# Patient Record
Sex: Female | Born: 1970 | Race: White | Hispanic: No | Marital: Married | Smoking: Never smoker
Health system: Southern US, Community
[De-identification: ages and names within clinical notes are randomized; demographics above are authoritative.]

## PROBLEM LIST (undated history)

## (undated) DIAGNOSIS — K759 Inflammatory liver disease, unspecified: Secondary | ICD-10-CM

## (undated) DIAGNOSIS — R3915 Urgency of urination: Secondary | ICD-10-CM

## (undated) DIAGNOSIS — F32A Depression, unspecified: Secondary | ICD-10-CM

## (undated) DIAGNOSIS — G4730 Sleep apnea, unspecified: Secondary | ICD-10-CM

## (undated) DIAGNOSIS — R51 Headache: Secondary | ICD-10-CM

## (undated) DIAGNOSIS — R351 Nocturia: Secondary | ICD-10-CM

## (undated) DIAGNOSIS — F329 Major depressive disorder, single episode, unspecified: Secondary | ICD-10-CM

## (undated) DIAGNOSIS — M1990 Unspecified osteoarthritis, unspecified site: Secondary | ICD-10-CM

## (undated) DIAGNOSIS — Z87410 Personal history of cervical dysplasia: Secondary | ICD-10-CM

## (undated) HISTORY — PX: COLONOSCOPY: SHX174

## (undated) HISTORY — DX: Unspecified osteoarthritis, unspecified site: M19.90

## (undated) HISTORY — DX: Morbid (severe) obesity due to excess calories: E66.01

---

## 1992-10-20 DIAGNOSIS — K759 Inflammatory liver disease, unspecified: Secondary | ICD-10-CM

## 1992-10-20 HISTORY — DX: Inflammatory liver disease, unspecified: K75.9

## 2000-01-15 ENCOUNTER — Other Ambulatory Visit: Admission: RE | Admit: 2000-01-15 | Discharge: 2000-01-15 | Payer: Self-pay | Admitting: Family Medicine

## 2001-03-12 ENCOUNTER — Other Ambulatory Visit: Admission: RE | Admit: 2001-03-12 | Discharge: 2001-03-12 | Payer: Self-pay | Admitting: Family Medicine

## 2001-06-17 ENCOUNTER — Other Ambulatory Visit: Admission: RE | Admit: 2001-06-17 | Discharge: 2001-06-17 | Payer: Self-pay | Admitting: Family Medicine

## 2001-12-09 ENCOUNTER — Other Ambulatory Visit: Admission: RE | Admit: 2001-12-09 | Discharge: 2001-12-09 | Payer: Self-pay | Admitting: Obstetrics and Gynecology

## 2002-06-21 ENCOUNTER — Ambulatory Visit (HOSPITAL_COMMUNITY): Admission: RE | Admit: 2002-06-21 | Discharge: 2002-06-21 | Payer: Self-pay | Admitting: Obstetrics

## 2002-06-21 ENCOUNTER — Encounter: Payer: Self-pay | Admitting: Obstetrics

## 2002-08-05 ENCOUNTER — Encounter: Payer: Self-pay | Admitting: Obstetrics & Gynecology

## 2002-08-05 ENCOUNTER — Ambulatory Visit (HOSPITAL_COMMUNITY): Admission: RE | Admit: 2002-08-05 | Discharge: 2002-08-05 | Payer: Self-pay | Admitting: Obstetrics & Gynecology

## 2002-10-20 HISTORY — PX: WISDOM TOOTH EXTRACTION: SHX21

## 2002-11-11 ENCOUNTER — Encounter: Payer: Self-pay | Admitting: Obstetrics & Gynecology

## 2002-11-11 ENCOUNTER — Ambulatory Visit (HOSPITAL_COMMUNITY): Admission: RE | Admit: 2002-11-11 | Discharge: 2002-11-11 | Payer: Self-pay | Admitting: Obstetrics & Gynecology

## 2002-11-24 ENCOUNTER — Ambulatory Visit (HOSPITAL_COMMUNITY): Admission: RE | Admit: 2002-11-24 | Discharge: 2002-11-24 | Payer: Self-pay | Admitting: Obstetrics & Gynecology

## 2002-11-24 ENCOUNTER — Encounter: Payer: Self-pay | Admitting: Obstetrics & Gynecology

## 2002-12-30 ENCOUNTER — Encounter (INDEPENDENT_AMBULATORY_CARE_PROVIDER_SITE_OTHER): Payer: Self-pay | Admitting: Specialist

## 2002-12-30 ENCOUNTER — Inpatient Hospital Stay (HOSPITAL_COMMUNITY): Admission: AD | Admit: 2002-12-30 | Discharge: 2003-01-02 | Payer: Self-pay | Admitting: Obstetrics & Gynecology

## 2004-03-27 ENCOUNTER — Inpatient Hospital Stay (HOSPITAL_COMMUNITY): Admission: AD | Admit: 2004-03-27 | Discharge: 2004-03-27 | Payer: Self-pay | Admitting: Obstetrics

## 2004-09-05 ENCOUNTER — Ambulatory Visit (HOSPITAL_COMMUNITY): Admission: RE | Admit: 2004-09-05 | Discharge: 2004-09-05 | Payer: Self-pay | Admitting: Obstetrics & Gynecology

## 2005-01-29 ENCOUNTER — Inpatient Hospital Stay (HOSPITAL_COMMUNITY): Admission: AD | Admit: 2005-01-29 | Discharge: 2005-01-31 | Payer: Self-pay | Admitting: Obstetrics & Gynecology

## 2006-02-26 ENCOUNTER — Ambulatory Visit (HOSPITAL_COMMUNITY): Admission: RE | Admit: 2006-02-26 | Discharge: 2006-02-26 | Payer: Self-pay | Admitting: Obstetrics & Gynecology

## 2006-07-01 ENCOUNTER — Inpatient Hospital Stay (HOSPITAL_COMMUNITY): Admission: AD | Admit: 2006-07-01 | Discharge: 2006-07-04 | Payer: Self-pay | Admitting: Obstetrics & Gynecology

## 2006-07-01 ENCOUNTER — Inpatient Hospital Stay (HOSPITAL_COMMUNITY): Admission: AD | Admit: 2006-07-01 | Discharge: 2006-07-01 | Payer: Self-pay | Admitting: Obstetrics & Gynecology

## 2008-02-09 ENCOUNTER — Ambulatory Visit (HOSPITAL_COMMUNITY): Admission: RE | Admit: 2008-02-09 | Discharge: 2008-02-09 | Payer: Self-pay | Admitting: Obstetrics & Gynecology

## 2009-09-25 ENCOUNTER — Ambulatory Visit (HOSPITAL_COMMUNITY): Admission: RE | Admit: 2009-09-25 | Discharge: 2009-09-25 | Payer: Self-pay | Admitting: Obstetrics & Gynecology

## 2010-10-16 ENCOUNTER — Ambulatory Visit (HOSPITAL_COMMUNITY)
Admission: RE | Admit: 2010-10-16 | Discharge: 2010-10-16 | Payer: Self-pay | Source: Home / Self Care | Attending: Obstetrics & Gynecology | Admitting: Obstetrics & Gynecology

## 2010-11-27 ENCOUNTER — Encounter: Payer: Self-pay | Admitting: Internal Medicine

## 2010-12-17 NOTE — Letter (Signed)
Summary: Pt cancelled appt/[facility]  Pt cancelled appt/[facility]   Imported By: Sherian Rein 12/09/2010 08:22:27  _____________________________________________________________________  External Attachment:    Type:   Image     Comment:   External Document

## 2011-03-07 NOTE — H&P (Signed)
NAME:  Monique Hamilton, Monique Hamilton NO.:  [id number]   MEDICAL RECORD NO.:  [id number]          PATIENT TYPE:  INP   LOCATION:  9173                          FACILITY:  [facility]   PHYSICIAN:  Roseanna Rainbow, M.D.DATE OF BIRTH:  09/01/1971   DATE OF ADMISSION:  07/01/2006  DATE OF DISCHARGE:                                HISTORY & PHYSICAL   CHIEF COMPLAINT:  The patient is a 40 year old para 3 with an estimated date  of confinement of September 11 with an intrauterine pregnancy at 40+ weeks  for an elective induction of labor.   HISTORY OF PRESENT ILLNESS:  Please see the above.   ALLERGIES:  No known drug allergies.   MEDICATIONS:  Prenatal vitamins.   OBSTETRICAL RICK FACTORS:  History of large for gestational age infant,  advanced maternal age.  Prenatal labs.  Hematocrit 37.1, hemoglobin 12,  platelets 242,000.  Chlamydia negative, GC negative, urine culture and  sensitivity, insignificant growth.  A 1 hour GCT 97, hepatitis B surface  antigen negative.  HIV nonreactive.  Blood type is A positive, antibody  screen negative.  RPR nonreactive.  Rubella immune.  GBS is negative.   PAST GYNECOLOGICAL HISTORY:  Noncontributory.   PAST MEDICAL HISTORY:  No significant history of medical diseases.   PAST SURGICAL HISTORY:  She denies.   SOCIAL HISTORY:  She is married, does not give any significant history of  alcohol use, and has no significant smoking history, denies illicit drug  use.   FAMILY HISTORY:  Arthritis, CVA, hypertension, myocardial infarction.   PAST OBSTETRICAL HISTORY:  In April 2006 she was delivered of female, full  term, spontaneous vaginal delivery; in June 2005 she had an early  spontaneous abortion; in March of 2004 she was delivered of a female, full  term, 9 pounds 10 ounces, vaginal delivery; and in September of 1994 she was  delivered of a live born female 7 pounds 11 ounces, spontaneous vaginal  delivery.   PHYSICAL EXAMINATION:  VITAL SIGNS:   Stable, afebrile.  GENERAL:  A well-developed, well-nourished female in no apparent distress.  ABDOMEN:  Gravid.  PELVIC:  Cervix 2 cm dilated, 60% effaced, fetal heart rate tracing  reassuring.  Tocodynamometer irregular contractions.   ASSESSMENT:  Multipara with an intrauterine pregnancy at term, favorable  Bishop's score.  1. Fetal heart tracing consistent with fetal well-being.   PLAN:  Admission, induction of labor.      Roseanna Rainbow, M.D.  Electronically Signed     LAJ/MEDQ  D:  07/02/2006  T:  07/02/2006  Job:  [id number]

## 2011-03-07 NOTE — H&P (Signed)
NAMECHARLIENE, Monique Hamilton           ACCOUNT NO.:  [id number]   MEDICAL RECORD NO.:  [id number]          PATIENT TYPE:  INP   LOCATION:  9170                          [facility]ILITY:  [facility]   PHYSICIAN:  Roseanna Rainbow, M.D.DATE OF BIRTH:  [birth year]   DATE OF ADMISSION:  01/29/2005  DATE OF DISCHARGE:                                HISTORY & PHYSICAL   CHIEF COMPLAINT:  The patient is a 40 year old, para 2, with an estimated  date of confinement of January 28, 2005, with an intrauterine pregnancy at 35-  1/7ths weeks, complaining of uterine contractions.   HISTORY OF PRESENT ILLNESS:  Please see the above.   ANTEPARTUM COURSE PROBLEMS AND RISKS:  1.  Advanced maternal age.  2.  History of a large for gestational age infant.  3.  Depression.  4.  Myomatous uterus.   PRENATAL SCREENS:  One hour GCT 90.  GBS negative.  Hematocrit 36.8,  hemoglobin 12.1.  RPR nonreactive.  Rubella immune.  HIV nonreactive.  Platelets 294,000.  Blood type A positive.  Ultrasound, September 05, 2004,  at 20 weeks' estimated gestational age:  No placenta previa, normal amniotic  fluid, a small myoma was noted.   PAST OBSTETRICAL/GYNECOLOGIC HISTORY:  1.  In 1994, she was delivered of a female, 7 pounds 11 ounces, full term      vaginal delivery, no complications.  2.  In March 2004, she was delivered of a female, 9 pounds 2 ounces,      spontaneous vaginal delivery.  3.  She had a spontaneous abortion first trimester.  4.  She has a history of an abnormal Pap smear in 2002.  5.  She has had laser conization.  6.  She had an abnormal Pap smear in 2005, with normal colposcopic exam and      negative high risk HPV typing.   PAST MEDICAL HISTORY:  Please see the above.   PAST SURGICAL HISTORY:  She has had oral surgery.   FAMILY HISTORY:  Remarkable for chronic hypertension.   SOCIAL HISTORY:  She is married.  She denies any tobacco, ethanol, or  substance abuse.   MEDICATIONS:  Prenatal vitamins,  Lexapro.   ALLERGIES:  No known drug allergies.   PHYSICAL EXAMINATION:  VITAL SIGNS:  Stable, afebrile.  Fetal heart tracing  reassuring.  GENERAL:  Uncomfortable.  PELVIC:  Sterile vaginal exam is per the RN, 5- to -6-cm, 80% effaced.   ASSESSMENT:  1.  Multipara at term.  2.  Active labor.   PLAN:  1.  Admission.  2.  Expectant management.      LAJ/MEDQ  D:  01/29/2005  T:  01/29/2005  Job:  [id number]

## 2011-09-09 ENCOUNTER — Other Ambulatory Visit: Payer: Self-pay | Admitting: Obstetrics & Gynecology

## 2011-09-09 DIAGNOSIS — Z1231 Encounter for screening mammogram for malignant neoplasm of breast: Secondary | ICD-10-CM

## 2011-10-22 ENCOUNTER — Ambulatory Visit (HOSPITAL_COMMUNITY)
Admission: RE | Admit: 2011-10-22 | Discharge: 2011-10-22 | Disposition: A | Discharge status: Home / Self Care | Payer: Federal, State, Local not specified - PPO | Source: Ambulatory Visit | Attending: Obstetrics & Gynecology | Admitting: Obstetrics & Gynecology

## 2011-10-22 DIAGNOSIS — Z1231 Encounter for screening mammogram for malignant neoplasm of breast: Secondary | ICD-10-CM | POA: Insufficient documentation

## 2011-12-19 HISTORY — PX: KNEE SURGERY: SHX244

## 2012-01-06 ENCOUNTER — Ambulatory Visit: Payer: Federal, State, Local not specified - PPO | Attending: Orthopedic Surgery | Admitting: Physical Therapy

## 2012-01-06 DIAGNOSIS — M25669 Stiffness of unspecified knee, not elsewhere classified: Secondary | ICD-10-CM | POA: Insufficient documentation

## 2012-01-06 DIAGNOSIS — M6281 Muscle weakness (generalized): Secondary | ICD-10-CM | POA: Insufficient documentation

## 2012-01-06 DIAGNOSIS — R609 Edema, unspecified: Secondary | ICD-10-CM | POA: Insufficient documentation

## 2012-01-06 DIAGNOSIS — IMO0001 Reserved for inherently not codable concepts without codable children: Secondary | ICD-10-CM | POA: Insufficient documentation

## 2012-01-06 DIAGNOSIS — M25569 Pain in unspecified knee: Secondary | ICD-10-CM | POA: Insufficient documentation

## 2012-01-12 ENCOUNTER — Encounter: Payer: Federal, State, Local not specified - PPO | Admitting: Physical Therapy

## 2012-01-14 ENCOUNTER — Ambulatory Visit: Payer: Federal, State, Local not specified - PPO

## 2012-01-20 ENCOUNTER — Ambulatory Visit: Payer: Federal, State, Local not specified - PPO | Attending: Orthopedic Surgery | Admitting: Physical Therapy

## 2012-01-20 DIAGNOSIS — M25569 Pain in unspecified knee: Secondary | ICD-10-CM | POA: Insufficient documentation

## 2012-01-20 DIAGNOSIS — M6281 Muscle weakness (generalized): Secondary | ICD-10-CM | POA: Insufficient documentation

## 2012-01-20 DIAGNOSIS — IMO0001 Reserved for inherently not codable concepts without codable children: Secondary | ICD-10-CM | POA: Insufficient documentation

## 2012-01-20 DIAGNOSIS — M25669 Stiffness of unspecified knee, not elsewhere classified: Secondary | ICD-10-CM | POA: Insufficient documentation

## 2012-01-20 DIAGNOSIS — R609 Edema, unspecified: Secondary | ICD-10-CM | POA: Insufficient documentation

## 2012-02-20 LAB — URINALYSIS
Bilirubin (Urine): NEGATIVE
Blood, UA: NEGATIVE
Ketones, urine: NEGATIVE
Specific Gravity, UA: 1.025 (ref 1.005–1.030)

## 2012-02-20 LAB — BASIC METABOLIC PANEL
ALT: 25 U/L (ref 7–35)
Albumin: 4.2
HCT: 42 %
Hemoglobin: 13.7 g/dL (ref 12.0–16.0)
MCH: 29.1
RBC: 4.71
Total bilirubin, fluid: 0.2
WBC: 10.9
platelet count: 344

## 2012-10-19 ENCOUNTER — Other Ambulatory Visit: Payer: Self-pay | Admitting: Obstetrics & Gynecology

## 2012-10-19 DIAGNOSIS — Z1231 Encounter for screening mammogram for malignant neoplasm of breast: Secondary | ICD-10-CM

## 2012-10-29 ENCOUNTER — Ambulatory Visit (HOSPITAL_COMMUNITY)
Admission: RE | Admit: 2012-10-29 | Discharge: 2012-10-29 | Disposition: A | Discharge status: Home / Self Care | Payer: Federal, State, Local not specified - PPO | Source: Ambulatory Visit | Attending: Obstetrics & Gynecology | Admitting: Obstetrics & Gynecology

## 2012-10-29 DIAGNOSIS — Z1231 Encounter for screening mammogram for malignant neoplasm of breast: Secondary | ICD-10-CM | POA: Insufficient documentation

## 2012-11-04 ENCOUNTER — Ambulatory Visit (INDEPENDENT_AMBULATORY_CARE_PROVIDER_SITE_OTHER): Payer: Federal, State, Local not specified - PPO | Admitting: Surgery

## 2012-11-18 ENCOUNTER — Ambulatory Visit (INDEPENDENT_AMBULATORY_CARE_PROVIDER_SITE_OTHER): Payer: Federal, State, Local not specified - PPO | Admitting: Surgery

## 2012-11-18 ENCOUNTER — Encounter (INDEPENDENT_AMBULATORY_CARE_PROVIDER_SITE_OTHER): Payer: Self-pay | Admitting: Surgery

## 2012-11-18 ENCOUNTER — Other Ambulatory Visit (INDEPENDENT_AMBULATORY_CARE_PROVIDER_SITE_OTHER): Payer: Self-pay

## 2012-11-18 VITALS — BP 110/74 | HR 68 | Temp 97.0°F | Resp 16 | Ht 66.5 in | Wt 273.0 lb

## 2012-11-18 DIAGNOSIS — E669 Obesity, unspecified: Secondary | ICD-10-CM

## 2012-11-18 DIAGNOSIS — M129 Arthropathy, unspecified: Secondary | ICD-10-CM

## 2012-11-18 DIAGNOSIS — M1990 Unspecified osteoarthritis, unspecified site: Secondary | ICD-10-CM | POA: Insufficient documentation

## 2012-11-18 NOTE — Progress Notes (Signed)
Chief Complaint:  Obesity BMI 43  History of Present Illness:  Monique Hamilton is an 42 y.o. female who presents today having been to one of our seminars and is interested in having placement of a lap band. She has struggled with her weight all of her life. She has been on multiple diets with interval successes and then weight regain. She has 2 sister-in-law's that had bypass one of them has regained most of her weight.  I discussed the LAP-BAND procedure with her in some detail. I gave her the LAP-BAND book.  She was to proceed, workup pathway for placement of a lap band.  Past Medical History  Diagnosis Date  . Arthritis     Past Surgical History  Procedure Date  . Knee surgery 12/2011    left  . Wisdom tooth extraction 2004    Current Outpatient Prescriptions  Medication Sig Dispense Refill  . Cholecalciferol (VITAMIN D PO) Take by mouth daily.      Marland Kitchen ibuprofen (ADVIL,MOTRIN) 200 MG tablet Take 200 mg by mouth as needed.      . Misc Natural Products (OSTEO BI-FLEX ADV DOUBLE ST PO) Take by mouth daily.      . phentermine (ADIPEX-P) 37.5 MG tablet        Review of patient's allergies indicates no known allergies. Family History  Problem Relation Age of Onset  . Breast cancer Mother   . Lung cancer Paternal Grandfather   . Cancer Maternal Grandmother    Social History:   reports that she has never smoked. She does not have any smokeless tobacco history on file. She reports that she drinks alcohol. She reports that she does not use illicit drugs.   REVIEW OF SYSTEMS - PERTINENT POSITIVES ONLY: No history of DVT. No prior abdominal surgery.  Physical Exam:   Blood pressure 110/74, pulse 68, temperature 97 F (36.1 C), temperature source Temporal, resp. rate 16, height 5' 6.5" (1.689 m), weight 273 lb (123.832 kg), last menstrual period 10/17/2012. Body mass index is 43.40 kg/(m^2).  Gen:  WDWN white female NAD  Neurological: Alert and oriented to person, place, and time.  Motor and sensory function is grossly intact  Head: Normocephalic and atraumatic.  Eyes: Conjunctivae are normal. Pupils are equal, round, and reactive to light. No scleral icterus.  Neck: Normal range of motion. Neck supple. No tracheal deviation or thyromegaly present.  Cardiovascular:  SR without murmurs or gallops.  No carotid bruits Respiratory: Effort normal.  No respiratory distress. No chest wall tenderness. Breath sounds normal.  No wheezes, rales or rhonchi.  Abdomen:  nontender GU: Musculoskeletal: Normal range of motion. Extremities are nontender. No cyanosis, edema or clubbing noted Lymphadenopathy: No cervical, preauricular, postauricular or axillary adenopathy is present Skin: Skin is warm and dry. No rash noted. No diaphoresis. No erythema. No pallor. Pscyh: Normal mood and affect. Behavior is normal. Judgment and thought content normal.   LABORATORY RESULTS: No results found for this or any previous visit (from the past 48 hour(s)).  RADIOLOGY RESULTS: No results found.  Problem List: Patient Active Problem List  Diagnosis  . Arthritis    Assessment & Plan: BMI 43 with arthritis but no diabetes. Questionable obstructive sleep apnea. No history of GERD or heartburn.  Plan work up for laparoscopic adjustable gastric banding.    Matt B. Daphine Deutscher, MD, [facility] [phone] beeper [phone]  11/18/2012 11:06 AM

## 2012-11-18 NOTE — Patient Instructions (Signed)

## 2012-11-25 ENCOUNTER — Encounter: Payer: Federal, State, Local not specified - PPO | Attending: Surgery | Admitting: *Deleted

## 2012-11-25 ENCOUNTER — Encounter: Payer: Self-pay | Admitting: *Deleted

## 2012-11-25 DIAGNOSIS — Z713 Dietary counseling and surveillance: Secondary | ICD-10-CM | POA: Insufficient documentation

## 2012-11-25 DIAGNOSIS — Z01818 Encounter for other preprocedural examination: Secondary | ICD-10-CM | POA: Insufficient documentation

## 2012-11-25 LAB — CBC WITH DIFFERENTIAL/PLATELET
Eosinophils Absolute: 0.1 10*3/uL (ref 0.0–0.7)
MCH: 30.5 pg (ref 26.0–34.0)
MCHC: 34.5 g/dL (ref 30.0–36.0)
Neutrophils Relative %: 56 % (ref 43–77)
Platelets: 296 10*3/uL (ref 150–400)

## 2012-11-25 LAB — COMPREHENSIVE METABOLIC PANEL
ALT: 29 U/L (ref 0–35)
AST: 24 U/L (ref 0–37)
Albumin: 4.3 g/dL (ref 3.5–5.2)
Alkaline Phosphatase: 46 U/L (ref 39–117)
BUN: 11 mg/dL (ref 6–23)
Chloride: 105 mEq/L (ref 96–112)
Glucose, Bld: 92 mg/dL (ref 70–99)
Potassium: 4.2 mEq/L (ref 3.5–5.3)
Sodium: 138 mEq/L (ref 135–145)
Total Bilirubin: 0.4 mg/dL (ref 0.3–1.2)
Total Protein: 6.7 g/dL (ref 6.0–8.3)

## 2012-11-25 NOTE — Progress Notes (Signed)
  Pre-Op Assessment Visit:  Pre-Operative LAGB Surgery  Medical Nutrition Therapy:  Appt start time: 1030   End time:  1130.  Patient was seen on 11/25/2012 for Pre-Operative LAGB Nutrition Assessment. Assessment and letter of approval faxed to [facility] B[facility]ric Surgery Program coordinator on 11/25/2012.  Approval letter sent to [facility] and will be available in the chart under the media tab.  TANITA  BODY COMP RESULTS  11/25/12   BMI (kg/m^2) 43.1   Fat Mass (lbs) 133.0   Fat Free Mass (lbs) 134.0   Total Body Water (lbs) 98.0   Handouts given during visit include:  Pre-Op Goals   Bariatric Surgery Protein Shakes  Patient to call for Pre-Op and Post-Op Nutrition Education at [facility] when surgery is scheduled.

## 2012-11-25 NOTE — Patient Instructions (Addendum)
   Follow Pre-Op Nutrition Goals to prepare for Lapband Surgery.   Call the [facility] at [phone] once you have been given your surgery date to enrolled in the Pre-Op Nutrition Class. You will need to attend this nutrition class 3-4 weeks prior to your surgery. 

## 2012-11-26 LAB — HCG, SERUM, QUALITATIVE: Preg, Serum: NEGATIVE

## 2012-12-06 ENCOUNTER — Ambulatory Visit (HOSPITAL_COMMUNITY)
Admission: RE | Admit: 2012-12-06 | Discharge: 2012-12-06 | Disposition: A | Discharge status: Home / Self Care | Payer: Federal, State, Local not specified - PPO | Source: Ambulatory Visit | Attending: Surgery | Admitting: Surgery

## 2012-12-06 ENCOUNTER — Other Ambulatory Visit: Payer: Self-pay

## 2012-12-06 DIAGNOSIS — Z6841 Body Mass Index (BMI) 40.0 and over, adult: Secondary | ICD-10-CM | POA: Insufficient documentation

## 2012-12-06 DIAGNOSIS — G4733 Obstructive sleep apnea (adult) (pediatric): Secondary | ICD-10-CM | POA: Insufficient documentation

## 2012-12-06 DIAGNOSIS — I517 Cardiomegaly: Secondary | ICD-10-CM | POA: Insufficient documentation

## 2012-12-06 DIAGNOSIS — M129 Arthropathy, unspecified: Secondary | ICD-10-CM | POA: Insufficient documentation

## 2012-12-06 DIAGNOSIS — Z13820 Encounter for screening for osteoporosis: Secondary | ICD-10-CM | POA: Insufficient documentation

## 2012-12-08 ENCOUNTER — Encounter (HOSPITAL_BASED_OUTPATIENT_CLINIC_OR_DEPARTMENT_OTHER): Payer: Federal, State, Local not specified - PPO

## 2012-12-15 ENCOUNTER — Ambulatory Visit (HOSPITAL_BASED_OUTPATIENT_CLINIC_OR_DEPARTMENT_OTHER): Payer: Federal, State, Local not specified - PPO | Attending: Surgery | Admitting: Radiology

## 2012-12-15 VITALS — Ht 66.0 in | Wt 265.0 lb

## 2012-12-15 DIAGNOSIS — G4733 Obstructive sleep apnea (adult) (pediatric): Secondary | ICD-10-CM

## 2012-12-15 DIAGNOSIS — G4737 Central sleep apnea in conditions classified elsewhere: Secondary | ICD-10-CM | POA: Insufficient documentation

## 2012-12-18 DIAGNOSIS — G4733 Obstructive sleep apnea (adult) (pediatric): Secondary | ICD-10-CM

## 2012-12-18 DIAGNOSIS — R0609 Other forms of dyspnea: Secondary | ICD-10-CM

## 2012-12-18 DIAGNOSIS — G4737 Central sleep apnea in conditions classified elsewhere: Secondary | ICD-10-CM

## 2012-12-18 DIAGNOSIS — R0989 Other specified symptoms and signs involving the circulatory and respiratory systems: Secondary | ICD-10-CM

## 2012-12-18 NOTE — Procedures (Signed)
NAME:  Monique Hamilton, Monique Hamilton           ACCOUNT NO.:  [id number]  MEDICAL RECORD NO.:  [id number]          PATIENT TYPE:  OUT  LOCATION:  SLEEP CENTER                 FACILITY:  [facility]  PHYSICIAN:  Clinton D. Maple Hudson, MD, FCCP, FACPDATE OF BIRTH:  06[birth year]06[birth year]1969  DATE OF STUDY:  12/15/2012                           NOCTURNAL POLYSOMNOGRAM  REFERRING PHYSICIAN:  Thornton Park. Daphine Deutscher, MD  INDICATION FOR STUDY:  Hypersomnia with sleep apnea.  EPWORTH SLEEPINESS SCORE:  6/24.  BMI 42.8, weight 265 pounds, height 66 inches, neck 14 inches.  MEDICATIONS:  Home medications charted and reviewed.  SLEEP ARCHITECTURE:  Total sleep time 274 minutes with a sleep efficiency 67.2%.  Stage I was 9.3%, stage II 66.4%, stage III 5.5%, REM 18.8% of total sleep time.  Sleep latency 36.5 minutes, REM latency 211.5 minutes, awake after sleep onset 97 minutes.  Arousal index 27.2. Bedtime medication:  None.  RESPIRATORY DATA:  Apnea-hypopnea index (AHI) 5.9 per hour.  A total of 27 events was scored including 2 central apneas and 25 hypopneas.  All events were associated with supine sleep position and REM.  REM AHI 4.7 per hour.  There were insufficient numbers of events to permit CPAP titration by split protocol.  OXYGEN DATA:  Moderate snoring with oxygen desaturation to a nadir of 90% and mean oxygen saturation through the study of 96.2% on room air.  CARDIAC DATA:  Normal sinus rhythm.  MOVEMENT-PARASOMNIA:  No significant movement disturbance.  Bathroom x1.  IMPRESSIONS-RECOMMENDATION: 1. Difficulty initiating and maintaining sleep.  She woke     spontaneously around midnight and did not achieve sleep again until     almost 2:00 a.m. without bedtime medication. 2. Minimal obstructive and central sleep apnea/hypopnea syndrome, AHI     5.9 per hour.  Supine events.  REM AHI 4.7 per hour.  Moderate     snoring with oxygen desaturation to a nadir of 90% and mean oxygen     saturation through the study of  96.2% on room air. 3. There were not enough events to qualify for split protocol CPAP     titration on this study.  Scores in this range     would usually be addressed with conservative measures including     weight loss and encouragement to sleep off flat off back.     Clinton D. Maple Hudson, MD, Tonny Bollman, FACP Diplomate, American Board of Sleep Medicine    CDY/MEDQ  D:  12/18/2012 15:34:25  T:  12/18/2012 23:51:48  Job:  [id number]

## 2012-12-21 ENCOUNTER — Encounter (HOSPITAL_BASED_OUTPATIENT_CLINIC_OR_DEPARTMENT_OTHER): Payer: Federal, State, Local not specified - PPO

## 2012-12-29 ENCOUNTER — Encounter (HOSPITAL_COMMUNITY)
Admission: RE | Disposition: A | Discharge status: Home / Self Care | Payer: Self-pay | Source: Ambulatory Visit | Attending: Surgery

## 2012-12-29 ENCOUNTER — Institutional Professional Consult (permissible substitution): Payer: Federal, State, Local not specified - PPO | Admitting: Pulmonary Disease

## 2012-12-29 ENCOUNTER — Ambulatory Visit (HOSPITAL_COMMUNITY)
Admission: RE | Admit: 2012-12-29 | Discharge: 2012-12-29 | Disposition: A | Discharge status: Home / Self Care | Payer: Federal, State, Local not specified - PPO | Source: Ambulatory Visit | Attending: Surgery | Admitting: Surgery

## 2012-12-29 HISTORY — PX: BREATH TEK H PYLORI: SHX5422

## 2012-12-29 SURGERY — BREATH TEST, FOR HELICOBACTER PYLORI

## 2012-12-30 ENCOUNTER — Encounter (HOSPITAL_COMMUNITY): Payer: Self-pay | Admitting: Surgery

## 2013-01-18 ENCOUNTER — Encounter: Payer: Self-pay | Admitting: Pulmonary Disease

## 2013-01-18 ENCOUNTER — Ambulatory Visit (INDEPENDENT_AMBULATORY_CARE_PROVIDER_SITE_OTHER): Payer: Federal, State, Local not specified - PPO | Admitting: Pulmonary Disease

## 2013-01-18 VITALS — BP 122/90 | HR 84 | Temp 97.7°F | Ht 66.0 in | Wt 257.2 lb

## 2013-01-18 DIAGNOSIS — G4733 Obstructive sleep apnea (adult) (pediatric): Secondary | ICD-10-CM

## 2013-01-18 NOTE — Progress Notes (Deleted)
  Subjective:    Patient ID: Monique Hamilton, female    DOB: [birth year], 42 y.o.   MRN: [id number]  HPI    Review of Systems  Constitutional: Negative for appetite change and unexpected weight change.  HENT: Negative for ear pain, congestion, sore throat, sneezing, trouble swallowing, dental problem and sinus pressure.   Respiratory: Positive for shortness of breath. Negative for cough.   Cardiovascular: Negative for chest pain, palpitations and leg swelling.  Gastrointestinal: Negative for abdominal pain.  Musculoskeletal: Negative for joint swelling.  Skin: Negative for rash.  Neurological: Positive for headaches.  Psychiatric/Behavioral: Positive for dysphoric mood. The patient is not nervous/anxious.        Objective:   Physical Exam        Assessment & Plan:

## 2013-01-18 NOTE — Patient Instructions (Signed)
Follow up as needed if your sleep pattern gets worse 

## 2013-01-18 NOTE — Progress Notes (Signed)
Chief Complaint  Patient presents with  . Advice Only    pt had sleep study 12/15/12 and was never giving results.     History of Present Illness: Monique Hamilton is a 42 y.o. female for evaluation of sleep problems.  She had a sleep study from February 2014.  This showed mild sleep apnea.  She was referred for further assesment of this.  She goes to sleep at 1030 pm.  She falls asleep quickly.  She wakes up one time to use the bathroom.  She feels tired in the morning.  She gets out of bed at 630 AM.  She denies morning headache.  She does not use anything to help her fall sleep or stay awake.  She denies sleep walking, sleep talking, bruxism, or nightmares.  There is no history of restless legs.  She denies sleep hallucinations, sleep paralysis, or cataplexy.  The Epworth score is 2 out of 24.  Tests: PSG 12/15/12 >> AHI 5.9, SpO2 low 90%.  Monique Hamilton  has a past medical history of Arthritis and Morbid obesity.  Monique Hamilton  has past surgical history that includes Knee surgery (12/2011); Wisdom tooth extraction (2004); and Breath tek h pylori (N/A, 12/29/2012).  Prior to Admission medications   Medication Sig Start Date End Date Taking? Authorizing Provider  Cholecalciferol (VITAMIN D PO) Take by mouth daily.   Yes Historical Provider, MD  ibuprofen (ADVIL,MOTRIN) 200 MG tablet Take 200 mg by mouth as needed.   Yes Historical Provider, MD  Misc Natural Products (OSTEO BI-FLEX ADV DOUBLE ST PO) Take by mouth daily.   Yes Historical Provider, MD  phentermine (ADIPEX-P) 37.5 MG tablet Take 37.5 mg by mouth daily before breakfast.  11/09/12  Yes Historical Provider, MD    No Known Allergies  Her family history includes Allergies in her mother; Breast cancer in her mother; Cancer in her maternal grandmother; and Lung cancer in her paternal grandfather.  She  reports that she has never smoked. She does not have any smokeless tobacco history on file. She reports that   drinks alcohol. She reports that she does not use illicit drugs.  Review of Systems  Constitutional: Negative for appetite change and unexpected weight change.  HENT: Negative for ear pain, congestion, sore throat, sneezing, trouble swallowing, dental problem and sinus pressure.   Respiratory: Positive for shortness of breath. Negative for cough.   Cardiovascular: Negative for chest pain, palpitations and leg swelling.  Gastrointestinal: Negative for abdominal pain.  Musculoskeletal: Negative for joint swelling.  Skin: Negative for rash.  Neurological: Positive for headaches.  Psychiatric/Behavioral: Positive for dysphoric mood. The patient is not nervous/anxious.    Physical Exam:  General - No distress ENT - No sinus tenderness, no oral exudate, scalloped tongue, MP 3, no LAN, no thyromegaly, TM clear, pupils equal/reactive Cardiac - s1s2 regular, no murmur, pulses symmetric Chest - No wheeze/rales/dullness, good air entry, normal respiratory excursion Back - No focal tenderness Abd - Soft, non-tender, no organomegaly, + bowel sounds Ext - No edema Neuro - Normal strength, cranial nerves intact Skin - No rashes Psych - Normal mood, and behavior

## 2013-01-18 NOTE — Assessment & Plan Note (Signed)
She has very mild sleep apnea, and minimal sleep symptoms.  Her sleep pattern has improved with weight loss efforts.  I have reviewed the recent sleep study results with the patient.  We discussed how sleep apnea can affect various health problems including risks for hypertension, cardiovascular disease, and diabetes.  We also discussed how sleep disruption can increase risks for accident, such as while driving.  Weight loss as a means of improving sleep apnea was also reviewed.  Additional treatment options discussed were CPAP therapy, oral appliance, and surgical intervention.  Will continue weight loss efforts.  She is to return to pulmonary/sleep for further assessment if her sleep pattern gets worse.

## 2013-04-28 ENCOUNTER — Ambulatory Visit: Payer: Federal, State, Local not specified - PPO

## 2013-05-04 ENCOUNTER — Ambulatory Visit (INDEPENDENT_AMBULATORY_CARE_PROVIDER_SITE_OTHER): Payer: Federal, State, Local not specified - PPO | Admitting: Surgery

## 2013-06-03 ENCOUNTER — Encounter (INDEPENDENT_AMBULATORY_CARE_PROVIDER_SITE_OTHER): Payer: Federal, State, Local not specified - PPO | Admitting: Surgery

## 2013-06-09 ENCOUNTER — Encounter: Payer: Federal, State, Local not specified - PPO | Attending: Surgery | Admitting: *Deleted

## 2013-06-09 ENCOUNTER — Encounter: Payer: Self-pay | Admitting: *Deleted

## 2013-06-09 DIAGNOSIS — Z713 Dietary counseling and surveillance: Secondary | ICD-10-CM | POA: Insufficient documentation

## 2013-06-09 NOTE — Progress Notes (Signed)
Bariatric Class:  Appt start time: 0830 end time:  0930.  Pre-Operative Nutrition Class  Patient was seen on 06/09/2013 for Pre-Operative Bariatric Surgery Education at [facility].   Surgery date: 06/28/13 Surgery type: LAGB Start weight at [facility]: 267 lbs (11/25/12)  Weight today: 254.5 lbs Weight change: 12.5 lbs Total weight lost: 12.5 lbs  Samples given per MNT protocol; Patient educated on appropriate usage: Bariatric Advantage Multivitamin Lot # [id number];  Exp: 06/15  Bariatric Advantage Calcium Citrate Lot # [id number]; Exp: 08/15  Bariatric Advantage Sublingual B12 Lot # [id number]; Exp: 10/15  Celebrate Vitamins Multivitamin Lot # [id number]; Exp: 01/16  Celebrate Vitamins Calcium Citrate Lot # [id number];  Exp: 01/16  BariActiv Vitamins Multivitamin Lot # [id number]; Exp: 05/16  BariActiv Vitamins Calcium Citrate Lot # [id number]; Exp: 05/16  BariActiv Vitamins Iron Lot # [id number]; Exp: 05/16  Unjury Protein Powder Lot # [id number]; Exp: 09/15 Lot # [id number]; Exp: 12/15  The following the learning objective met by the patient during this course:  Identify Pre-Op Dietary Goals and will begin 2 weeks pre-operatively  Identify appropriate sources of fluids and proteins   State protein recommendations and appropriate sources pre and post-operatively  Identify Post-Operative Dietary Goals and will follow for 2 weeks post-operatively  Identify appropriate multivitamin and calcium sources  Describe the need for physical activity post-operatively and will follow MD recommendations  State when to call healthcare provider regarding medication questions or post-operative complications  Handouts given during class include:  Pre-Op Bariatric Surgery Diet Handout  Protein Shake Handout  Post-Op Bariatric Surgery Nutrition Handout  BELT Program Information Flyer  Support Group Information Flyer  [facility] Bariatric Supplements Price  List  Follow-Up Plan: Patient will follow-up at [facility] 2 weeks post operatively for diet advancement per MD.

## 2013-06-13 NOTE — Progress Notes (Signed)
Dr Daphine Deutscher-  NEED PRE OP ORDERS PLEASE-  Has appt PST 06/22/13  [facility]

## 2013-06-15 ENCOUNTER — Encounter (HOSPITAL_COMMUNITY): Payer: Self-pay | Admitting: Pharmacy Technician

## 2013-06-16 ENCOUNTER — Ambulatory Visit (INDEPENDENT_AMBULATORY_CARE_PROVIDER_SITE_OTHER): Payer: Federal, State, Local not specified - PPO | Admitting: Surgery

## 2013-06-16 ENCOUNTER — Encounter (INDEPENDENT_AMBULATORY_CARE_PROVIDER_SITE_OTHER): Payer: Self-pay | Admitting: Surgery

## 2013-06-16 NOTE — Patient Instructions (Signed)

## 2013-06-16 NOTE — Progress Notes (Signed)
Chief Complaint:  Obesity BMI 43  History of Present Illness:  Monique Hamilton is an 42 y.o. female who presents today having been to one of our seminars and is interested in having placement of a lap band. She has struggled with her weight all of her life. She has been on multiple diets with interval successes and then weight regain. She has 2 sister-in-law's that had bypass one of them has regained most of her weight.  I discussed the LAP-BAND procedure with her in some detail. I gave her the LAP-BAND book.  She has completed a preoperative workup including an upper GI series which showed no hiatal hernia. Today's BMI is 40.67. I have answered all of her questions and she and her husband here and excited to undergo laparoscopic adjustable gastric banding.  Past Medical History  Diagnosis Date  . Arthritis   . Morbid obesity     Past Surgical History  Procedure Laterality Date  . Knee surgery  12/2011    left  . Wisdom tooth extraction  2004  . Breath tek h pylori N/A 12/29/2012    Procedure: BREATH TEK H PYLORI;  Surgeon: Valarie Merino, MD;  Location: [facility];  Service: General;  Laterality: N/A;    Current Outpatient Prescriptions  Medication Sig Dispense Refill  . cholecalciferol (VITAMIN D) 1000 UNITS tablet Take 1,000 Units by mouth daily.      Marland Kitchen ibuprofen (ADVIL,MOTRIN) 200 MG tablet Take 600 mg by mouth every 6 (six) hours as needed for pain.       . Misc Natural Products (OSTEO BI-FLEX ADV DOUBLE ST PO) Take 1 tablet by mouth 2 (two) times daily.        No current facility-administered medications for this visit.   Review of patient's allergies indicates no known allergies. Family History  Problem Relation Age of Onset  . Breast cancer Mother   . Lung cancer Paternal Grandfather   . Cancer Maternal Grandmother   . Allergies Mother    Social History:   reports that she has never smoked. She has never used smokeless tobacco. She reports that  drinks alcohol. She  reports that she does not use illicit drugs.   REVIEW OF SYSTEMS - PERTINENT POSITIVES ONLY: No history of DVT. No prior abdominal surgery.no new changes to her review of systems.  Physical Exam:   Blood pressure 138/78, pulse 80, temperature 98.1 F (36.7 C), temperature source Temporal, resp. rate 15, height 5\' 6"  (1.676 m), weight 252 lb (114.306 kg). Body mass index is 40.69 kg/(m^2).  Gen:  WDWN white female NAD  Neurological: Alert and oriented to person, place, and time. Motor and sensory function is grossly intact  Head: Normocephalic and atraumatic.  Eyes: Conjunctivae are normal. Pupils are equal, round, and reactive to light. No scleral icterus.  Neck: Normal range of motion. Neck supple. No tracheal deviation or thyromegaly present.  Cardiovascular:  SR without murmurs or gallops.  No carotid bruits Respiratory: Effort normal.  No respiratory distress. No chest wall tenderness. Breath sounds normal.  No wheezes, rales or rhonchi.  Abdomen:  nontender GU: Musculoskeletal: Normal range of motion. Extremities are nontender. No cyanosis, edema or clubbing noted Lymphadenopathy: No cervical, preauricular, postauricular or axillary adenopathy is present Skin: Skin is warm and dry. No rash noted. No diaphoresis. No erythema. No pallor. Pscyh: Normal mood and affect. Behavior is normal. Judgment and thought content normal.   LABORATORY RESULTS: No results found for this or any previous visit (from  the past 48 hour(s)).  RADIOLOGY RESULTS: No results found.  Problem List: Patient Active Problem List   Diagnosis Date Noted  . OSA (obstructive sleep apnea) 01/18/2013  . Arthritis 11/18/2012    Assessment & Plan: BMI 43 with arthritis but no diabetes. Questionable obstructive sleep apnea. No history of GERD or heartburn. Upper GI was normal. She is ready for her upcoming laparoscopic adjustable gastric band. She may be able to go home on the same day. We discussed the procedure  and its limitations drawbacks etc. She is ready for laparoscopic adjustable gastric banding.    Matt B. Daphine Deutscher, MD, [facility] [phone] beeper [phone]  06/16/2013 11:35 AM

## 2013-06-22 ENCOUNTER — Encounter (HOSPITAL_COMMUNITY): Payer: Self-pay

## 2013-06-22 ENCOUNTER — Encounter (HOSPITAL_COMMUNITY)
Admission: RE | Admit: 2013-06-22 | Discharge: 2013-06-22 | Disposition: A | Discharge status: Home / Self Care | Payer: Federal, State, Local not specified - PPO | Source: Ambulatory Visit | Attending: Surgery | Admitting: Surgery

## 2013-06-22 DIAGNOSIS — Z01812 Encounter for preprocedural laboratory examination: Secondary | ICD-10-CM | POA: Insufficient documentation

## 2013-06-22 HISTORY — DX: Major depressive disorder, single episode, unspecified: F32.9

## 2013-06-22 HISTORY — DX: Depression, unspecified: F32.A

## 2013-06-22 HISTORY — DX: Urgency of urination: R39.15

## 2013-06-22 HISTORY — DX: Inflammatory liver disease, unspecified: K75.9

## 2013-06-22 HISTORY — DX: Sleep apnea, unspecified: G47.30

## 2013-06-22 HISTORY — DX: Headache: R51

## 2013-06-22 HISTORY — DX: Nocturia: R35.1

## 2013-06-22 HISTORY — DX: Personal history of cervical dysplasia: Z87.410

## 2013-06-22 LAB — COMPREHENSIVE METABOLIC PANEL
ALT: 17 U/L (ref 0–35)
AST: 18 U/L (ref 0–37)
Alkaline Phosphatase: 52 U/L (ref 39–117)
CO2: 27 mEq/L (ref 19–32)
Calcium: 10.5 mg/dL (ref 8.4–10.5)
Chloride: 104 mEq/L (ref 96–112)
GFR calc Af Amer: >90 mL/min (ref >90)
GFR calc non Af Amer: >90 mL/min (ref >90)
Glucose, Bld: 101 mg/dL — ABNORMAL HIGH (ref 70–99)
Potassium: 4.7 mEq/L (ref 3.5–5.1)
Sodium: 137 mEq/L (ref 135–145)
Total Bilirubin: 0.3 mg/dL (ref 0.3–1.2)

## 2013-06-22 LAB — CBC WITH DIFFERENTIAL/PLATELET
Hemoglobin: 13.8 g/dL (ref 12.0–15.0)
Lymphs Abs: 2.5 10*3/uL (ref 0.7–4.0)
MCH: 30.4 pg (ref 26.0–34.0)
Monocytes Relative: 6 % (ref 3–12)
Neutro Abs: 4 10*3/uL (ref 1.7–7.7)
Neutrophils Relative %: 56 % (ref 43–77)
RBC: 4.54 MIL/uL (ref 3.87–5.11)

## 2013-06-22 NOTE — Patient Instructions (Addendum)
GERTRUDE BUCKS  06/22/2013                           YOUR PROCEDURE IS SCHEDULED ON:  06/28/13               PLEASE REPORT TO SHORT STAY CENTER AT :  5;30 AM               CALL THIS NUMBER IF ANY PROBLEMS THE DAY OF SURGERY :               [phone]                      REMEMBER:   Do not eat food or drink liquids AFTER MIDNIGHT   Take these medicines the morning of surgery with A SIP OF WATER: none   Do not wear jewelry, make-up   Do not wear lotions, powders, or perfumes.   Do not shave legs or underarms 12 hrs. before surgery (men may shave face)  Do not bring valuables to the hospital.  Contacts, dentures or bridgework may not be worn into surgery.  Leave suitcase in the car. After surgery it may be brought to your room.  For patients admitted to the hospital more than one night, checkout time is 11:00                          The day of discharge.   Patients discharged the day of surgery will not be allowed to drive home                             If going home same day of surgery, must have someone stay with you first                           24 hrs at home and arrange for some one to drive you home from hospital.    Special Instructions:   Please read over the following fact sheets that you were given:              1. [place] PREPARING FOR SURGERY SHEET  2. STOP ASPIRIN / VITAMINS AND HERBAL MEDS 7 DAYS PREOP                                                X_____________________________________________________________________        Failure to follow these instructions may result in cancellation of your surgery

## 2013-06-27 ENCOUNTER — Other Ambulatory Visit (INDEPENDENT_AMBULATORY_CARE_PROVIDER_SITE_OTHER): Payer: Self-pay | Admitting: Surgery

## 2013-06-27 NOTE — Anesthesia Preprocedure Evaluation (Addendum)
Anesthesia Evaluation  Patient identified by MRN, date of birth, ID band Patient awake    Reviewed: Allergy & Precautions, H&P , NPO status , Patient's Chart, lab work & pertinent test results  Airway Mallampati: II TM Distance: >3 FB Neck ROM: Full    Dental  (+) Teeth Intact and Dental Advisory Given   Pulmonary sleep apnea ,  breath sounds clear to auscultation  Pulmonary exam normal       Cardiovascular negative cardio ROS  Rhythm:Regular Rate:Normal     Neuro/Psych  Headaches, Depression negative neurological ROS     GI/Hepatic negative GI ROS, (+) Hepatitis -  Endo/Other  Morbid obesity  Renal/GU negative Renal ROS  negative genitourinary   Musculoskeletal negative musculoskeletal ROS (+)   Abdominal   Peds  Hematology negative hematology ROS (+)   Anesthesia Other Findings   Reproductive/Obstetrics                          Anesthesia Physical Anesthesia Plan  ASA: II  Anesthesia Plan: General   Post-op Pain Management:    Induction: Intravenous  Airway Management Planned: Oral ETT  Additional Equipment:   Intra-op Plan:   Post-operative Plan: Extubation in OR  Informed Consent: I have reviewed the patients History and Physical, chart, labs and discussed the procedure including the risks, benefits and alternatives for the proposed anesthesia with the patient or authorized representative who has indicated his/her understanding and acceptance.   Dental advisory given  Plan Discussed with: CRNA  Anesthesia Plan Comments:        Anesthesia Quick Evaluation

## 2013-06-28 ENCOUNTER — Ambulatory Visit (HOSPITAL_COMMUNITY): Payer: Federal, State, Local not specified - PPO | Admitting: Anesthesiology

## 2013-06-28 ENCOUNTER — Encounter (HOSPITAL_COMMUNITY): Payer: Self-pay | Admitting: *Deleted

## 2013-06-28 ENCOUNTER — Ambulatory Visit (HOSPITAL_COMMUNITY): Payer: Federal, State, Local not specified - PPO

## 2013-06-28 ENCOUNTER — Encounter (HOSPITAL_COMMUNITY): Payer: Self-pay | Admitting: Anesthesiology

## 2013-06-28 ENCOUNTER — Encounter (HOSPITAL_COMMUNITY)
Admission: RE | Disposition: A | Discharge status: Home / Self Care | Payer: Self-pay | Source: Ambulatory Visit | Attending: Surgery

## 2013-06-28 ENCOUNTER — Ambulatory Visit (HOSPITAL_COMMUNITY)
Admission: RE | Admit: 2013-06-28 | Discharge: 2013-06-28 | Disposition: A | Discharge status: Home / Self Care | Payer: Federal, State, Local not specified - PPO | Source: Ambulatory Visit | Attending: Surgery | Admitting: Surgery

## 2013-06-28 DIAGNOSIS — M129 Arthropathy, unspecified: Secondary | ICD-10-CM | POA: Insufficient documentation

## 2013-06-28 DIAGNOSIS — Z6841 Body Mass Index (BMI) 40.0 and over, adult: Secondary | ICD-10-CM | POA: Insufficient documentation

## 2013-06-28 DIAGNOSIS — G4733 Obstructive sleep apnea (adult) (pediatric): Secondary | ICD-10-CM

## 2013-06-28 HISTORY — PX: MESH APPLIED TO LAP PORT: SHX5969

## 2013-06-28 HISTORY — PX: LAPAROSCOPIC GASTRIC BANDING: SHX1100

## 2013-06-28 SURGERY — GASTRIC BANDING, LAPAROSCOPIC
Anesthesia: General | Site: Abdomen | Wound class: Clean

## 2013-06-28 MED ORDER — SODIUM CHLORIDE 0.9 % IJ SOLN
INTRAMUSCULAR | Status: DC | PRN
  Administered 2013-06-28: 20 mL via INTRAVENOUS

## 2013-06-28 MED ORDER — DEXTROSE 5 % IV SOLN
INTRAVENOUS | Status: AC
  Filled 2013-06-28 (×2): qty 1

## 2013-06-28 MED ORDER — ROCURONIUM BROMIDE 100 MG/10ML IV SOLN
INTRAVENOUS | Status: DC | PRN
  Administered 2013-06-28: 10 mg via INTRAVENOUS
  Administered 2013-06-28: 40 mg via INTRAVENOUS

## 2013-06-28 MED ORDER — HYDROMORPHONE HCL PF 1 MG/ML IJ SOLN
INTRAMUSCULAR | Status: AC
  Filled 2013-06-28: qty 1

## 2013-06-28 MED ORDER — PROPOFOL 10 MG/ML IV BOLUS
INTRAVENOUS | Status: DC | PRN
  Administered 2013-06-28: 200 mg via INTRAVENOUS

## 2013-06-28 MED ORDER — HYDROMORPHONE HCL PF 1 MG/ML IJ SOLN
0.2500 mg | INTRAMUSCULAR | Status: DC | PRN
  Administered 2013-06-28 (×4): 0.5 mg via INTRAVENOUS

## 2013-06-28 MED ORDER — BUPIVACAINE LIPOSOME 1.3 % IJ SUSP
20.0000 mL | Freq: Once | INTRAMUSCULAR | Status: DC
  Filled 2013-06-28: qty 20

## 2013-06-28 MED ORDER — MIDAZOLAM HCL 5 MG/5ML IJ SOLN
INTRAMUSCULAR | Status: DC | PRN
  Administered 2013-06-28: 2 mg via INTRAVENOUS

## 2013-06-28 MED ORDER — ONDANSETRON 4 MG PO TBDP: 4.0000 mg | ORAL_TABLET | Freq: Three times a day (TID) | ORAL | Status: DC | PRN

## 2013-06-28 MED ORDER — DEXAMETHASONE SODIUM PHOSPHATE 10 MG/ML IJ SOLN
INTRAMUSCULAR | Status: DC | PRN
  Administered 2013-06-28: 10 mg via INTRAVENOUS

## 2013-06-28 MED ORDER — LIDOCAINE HCL (CARDIAC) 20 MG/ML IV SOLN
INTRAVENOUS | Status: DC | PRN
  Administered 2013-06-28: 100 mg via INTRAVENOUS

## 2013-06-28 MED ORDER — ONDANSETRON HCL 4 MG/2ML IJ SOLN
INTRAMUSCULAR | Status: DC | PRN
  Administered 2013-06-28: 4 mg via INTRAVENOUS

## 2013-06-28 MED ORDER — PROMETHAZINE HCL 25 MG/ML IJ SOLN: 6.2500 mg | INTRAMUSCULAR | Status: DC | PRN

## 2013-06-28 MED ORDER — SODIUM CHLORIDE 0.9 % IJ SOLN
INTRAMUSCULAR | Status: AC
  Filled 2013-06-28: qty 50

## 2013-06-28 MED ORDER — FENTANYL CITRATE 0.05 MG/ML IJ SOLN
INTRAMUSCULAR | Status: DC | PRN
  Administered 2013-06-28 (×2): 50 ug via INTRAVENOUS
  Administered 2013-06-28: 100 ug via INTRAVENOUS

## 2013-06-28 MED ORDER — EPHEDRINE SULFATE 50 MG/ML IJ SOLN
INTRAMUSCULAR | Status: DC | PRN
  Administered 2013-06-28: 10 mg via INTRAVENOUS
  Administered 2013-06-28: 15 mg via INTRAVENOUS

## 2013-06-28 MED ORDER — DEXTROSE 5 % IV SOLN
2.0000 g | INTRAVENOUS | Status: AC
  Administered 2013-06-28: 2 g via INTRAVENOUS
  Filled 2013-06-28: qty 2

## 2013-06-28 MED ORDER — OXYCODONE-ACETAMINOPHEN 5-325 MG PO TABS: 1.0000 | ORAL_TABLET | ORAL | Status: DC | PRN

## 2013-06-28 MED ORDER — NEOSTIGMINE METHYLSULFATE 1 MG/ML IJ SOLN
INTRAMUSCULAR | Status: DC | PRN
  Administered 2013-06-28: 5 mg via INTRAVENOUS

## 2013-06-28 MED ORDER — GLYCOPYRROLATE 0.2 MG/ML IJ SOLN
INTRAMUSCULAR | Status: DC | PRN
  Administered 2013-06-28: 0.6 mg via INTRAVENOUS
  Administered 2013-06-28: 0.2 mg via INTRAVENOUS

## 2013-06-28 MED ORDER — SUCCINYLCHOLINE CHLORIDE 20 MG/ML IJ SOLN
INTRAMUSCULAR | Status: DC | PRN
  Administered 2013-06-28: 100 mg via INTRAVENOUS

## 2013-06-28 MED ORDER — LACTATED RINGERS IV SOLN
INTRAVENOUS | Status: DC | PRN
  Administered 2013-06-28 (×2): via INTRAVENOUS

## 2013-06-28 MED ORDER — OXYCODONE-ACETAMINOPHEN 5-325 MG/5ML PO SOLN
5.0000 mL | ORAL | Status: DC | PRN
  Administered 2013-06-28: 5 mL via ORAL
  Filled 2013-06-28: qty 5

## 2013-06-28 MED ORDER — BUPIVACAINE LIPOSOME 1.3 % IJ SUSP
INTRAMUSCULAR | Status: DC | PRN
  Administered 2013-06-28: 20 mL

## 2013-06-28 MED ORDER — HEPARIN SODIUM (PORCINE) 5000 UNIT/ML IJ SOLN
5000.0000 [IU] | INTRAMUSCULAR | Status: AC
  Administered 2013-06-28: 5000 [IU] via SUBCUTANEOUS
  Filled 2013-06-28: qty 1

## 2013-06-28 MED ORDER — LACTATED RINGERS IV SOLN: INTRAVENOUS | Status: DC

## 2013-06-28 SURGICAL SUPPLY — 59 items
ADH SKN CLS APL DERMABOND .7 (GAUZE/BANDAGES/DRESSINGS) ×1
APL SKNCLS STERI-STRIP NONHPOA (GAUZE/BANDAGES/DRESSINGS)
BAND LAP 10.0 W/TUBES (Band) ×2 IMPLANT
BENZOIN TINCTURE PRP APPL 2/3 (GAUZE/BANDAGES/DRESSINGS) IMPLANT
BLADE HEX COATED 2.75 (ELECTRODE) ×2 IMPLANT
BLADE SURG 15 STRL LF DISP TIS (BLADE) ×1 IMPLANT
BLADE SURG 15 STRL SS (BLADE) ×2
CANISTER SUCTION 2500CC (MISCELLANEOUS) ×1 IMPLANT
CLOTH BEACON ORANGE TIMEOUT ST (SAFETY) ×1 IMPLANT
DECANTER SPIKE VIAL GLASS SM (MISCELLANEOUS) ×3 IMPLANT
DERMABOND ADVANCED (GAUZE/BANDAGES/DRESSINGS) ×1
DERMABOND ADVANCED .7 DNX12 (GAUZE/BANDAGES/DRESSINGS) IMPLANT
DEVICE SUT QUICK LOAD TK 5 (STAPLE) ×6 IMPLANT
DEVICE SUT TI-KNOT TK 5X26 (MISCELLANEOUS) ×2 IMPLANT
DEVICE SUTURE ENDOST 10MM (ENDOMECHANICALS) IMPLANT
DISSECTOR BLUNT TIP ENDO 5MM (MISCELLANEOUS) IMPLANT
DRAPE CAMERA CLOSED 9X96 (DRAPES) ×3 IMPLANT
ELECT REM PT RETURN 9FT ADLT (ELECTROSURGICAL) ×2
ELECTRODE REM PT RTRN 9FT ADLT (ELECTROSURGICAL) ×1 IMPLANT
GLOVE BIOGEL M 8.0 STRL (GLOVE) ×2 IMPLANT
GOWN STRL REIN XL XLG (GOWN DISPOSABLE) ×9 IMPLANT
HOVERMATT SINGLE USE (MISCELLANEOUS) ×1 IMPLANT
KIT BASIN OR (CUSTOM PROCEDURE TRAY) ×2 IMPLANT
MESH HERNIA 1X4 RECT BARD (Mesh General) ×1 IMPLANT
MESH HERNIA BARD 1X4 (Mesh General) ×1 IMPLANT
NDL SPNL 22GX3.5 QUINCKE BK (NEEDLE) ×1 IMPLANT
NEEDLE SPNL 22GX3.5 QUINCKE BK (NEEDLE) ×2 IMPLANT
NS IRRIG 1000ML POUR BTL (IV SOLUTION) ×2 IMPLANT
PACK UNIVERSAL I (CUSTOM PROCEDURE TRAY) ×2 IMPLANT
PENCIL BUTTON HOLSTER BLD 10FT (ELECTRODE) ×2 IMPLANT
SCALPEL HARMONIC ACE (MISCELLANEOUS) IMPLANT
SET IRRIG TUBING LAPAROSCOPIC (IRRIGATION / IRRIGATOR) IMPLANT
SHEARS CURVED HARMONIC AC 45CM (MISCELLANEOUS) IMPLANT
SLEEVE ADV FIXATION 5X100MM (TROCAR) IMPLANT
SLEEVE XCEL OPT CAN 5 100 (ENDOMECHANICALS) ×3 IMPLANT
SOLUTION ANTI FOG 6CC (MISCELLANEOUS) ×2 IMPLANT
SPONGE GAUZE 4X4 12PLY (GAUZE/BANDAGES/DRESSINGS) ×1 IMPLANT
SPONGE LAP 18X18 X RAY DECT (DISPOSABLE) ×2 IMPLANT
STAPLER VISISTAT 35W (STAPLE) ×2 IMPLANT
STRIP CLOSURE SKIN 1/2X4 (GAUZE/BANDAGES/DRESSINGS) IMPLANT
SUT ETHIBOND 2 0 SH (SUTURE) ×6
SUT ETHIBOND 2 0 SH 36X2 (SUTURE) ×3 IMPLANT
SUT PROLENE 2 0 CT2 30 (SUTURE) ×2 IMPLANT
SUT SILK 0 (SUTURE) ×2
SUT SILK 0 30XBRD TIE 6 (SUTURE) ×1 IMPLANT
SUT SURGIDAC NAB ES-9 0 48 120 (SUTURE) IMPLANT
SUT VIC AB 2-0 SH 27 (SUTURE)
SUT VIC AB 2-0 SH 27X BRD (SUTURE) IMPLANT
SUT VIC AB 4-0 SH 18 (SUTURE) ×2 IMPLANT
SYR 20CC LL (SYRINGE) ×4 IMPLANT
TOWEL OR 17X26 10 PK STRL BLUE (TOWEL DISPOSABLE) ×3 IMPLANT
TOWEL OR NON WOVEN STRL DISP B (DISPOSABLE) ×2 IMPLANT
TROCAR ADV FIXATION 11X100MM (TROCAR) IMPLANT
TROCAR BLADELESS 15MM (ENDOMECHANICALS) ×2 IMPLANT
TROCAR BLADELESS OPT 5 100 (ENDOMECHANICALS) ×2 IMPLANT
TROCAR XCEL NON-BLD 11X100MML (ENDOMECHANICALS) ×1 IMPLANT
TROCAR XCEL UNIV SLVE 11M 100M (ENDOMECHANICALS) ×1 IMPLANT
TUBE CALIBRATION LAPBAND (TUBING) ×2 IMPLANT
TUBING INSUFFLATION 10FT LAP (TUBING) ×2 IMPLANT

## 2013-06-28 NOTE — Progress Notes (Signed)
DG Abdomen X-RAY DONE.

## 2013-06-28 NOTE — Interval H&P Note (Signed)
History and Physical Interval Note:  06/28/2013 7:09 AM  Monique Hamilton  has presented today for surgery, with the diagnosis of morbid obesity  The various methods of treatment have been discussed with the patient and family. After consideration of risks, benefits and other options for treatment, the patient has consented to  Procedure(s): LAPAROSCOPIC GASTRIC BANDING (N/A) as a surgical intervention .  The patient's history has been reviewed, patient examined, no change in status, stable for surgery.  I have reviewed the patient's chart and labs.  Questions were answered to the patient's satisfaction.     Shiva Sahagian B

## 2013-06-28 NOTE — Transfer of Care (Signed)
Immediate Anesthesia Transfer of Care Note  Patient: Monique Hamilton  Procedure(s) Performed: Procedure(s): LAPAROSCOPIC GASTRIC BANDING (N/A) MESH APPLIED TO LAP PORT (N/A)  Patient Location: PACU  Anesthesia Type:General  Level of Consciousness: sedated  Airway & Oxygen Therapy: Patient Spontanous Breathing and Patient connected to face mask oxygen  Post-op Assessment: Report given to PACU RN and Post -op Vital signs reviewed and stable  Post vital signs: Reviewed and stable  Complications: No apparent anesthesia complications

## 2013-06-28 NOTE — Progress Notes (Signed)
Jacki Cones, Bariatric nurse educator, has been in seeing patient. States she is only having waves of nausea and still doesn't need any med. She was encourage to sleep , a little, by Jacki Cones. prior to DC

## 2013-06-28 NOTE — Anesthesia Postprocedure Evaluation (Signed)
Anesthesia Post Note  Patient: Monique Hamilton  Procedure(s) Performed: Procedure(s) (LRB): LAPAROSCOPIC GASTRIC BANDING (N/A) MESH APPLIED TO LAP PORT (N/A)  Anesthesia type: General  Patient location: PACU  Post pain: Pain level controlled  Post assessment: Post-op Vital signs reviewed  Last Vitals:  Filed Vitals:   06/28/13 0945  BP: 136/71  Pulse: 63  Temp:   Resp: 11    Post vital signs: Reviewed  Level of consciousness: sedated  Complications: No apparent anesthesia complications

## 2013-06-28 NOTE — Progress Notes (Signed)
Patient has been ambulating in hall. Tolerated fairly well. Became  A little nauseated while up. But denied need for prn at present

## 2013-06-28 NOTE — H&P (View-Only) (Signed)
Chief Complaint:  Obesity BMI 43  History of Present Illness:  Monique Hamilton is an 42 y.o. female who presents today having been to one of our seminars and is interested in having placement of a lap band. She has struggled with her weight all of her life. She has been on multiple diets with interval successes and then weight regain. She has 2 sister-in-law's that had bypass one of them has regained most of her weight.  I discussed the LAP-BAND procedure with her in some detail. I gave her the LAP-BAND book.  She has completed a preoperative workup including an upper GI series which showed no hiatal hernia. Today's BMI is 40.67. I have answered all of her questions and she and her husband here and excited to undergo laparoscopic adjustable gastric banding.  Past Medical History  Diagnosis Date  . Arthritis   . Morbid obesity     Past Surgical History  Procedure Laterality Date  . Knee surgery  12/2011    left  . Wisdom tooth extraction  2004  . Breath tek h pylori N/A 12/29/2012    Procedure: BREATH TEK H PYLORI;  Surgeon: Monita Swier B Brenisha Tsui, MD;  Location: [facility];  Service: General;  Laterality: N/A;    Current Outpatient Prescriptions  Medication Sig Dispense Refill  . cholecalciferol (VITAMIN D) 1000 UNITS tablet Take 1,000 Units by mouth daily.      . ibuprofen (ADVIL,MOTRIN) 200 MG tablet Take 600 mg by mouth every 6 (six) hours as needed for pain.       . Misc Natural Products (OSTEO BI-FLEX ADV DOUBLE ST PO) Take 1 tablet by mouth 2 (two) times daily.        No current facility-administered medications for this visit.   Review of patient's allergies indicates no known allergies. Family History  Problem Relation Age of Onset  . Breast cancer Mother   . Lung cancer Paternal Grandfather   . Cancer Maternal Grandmother   . Allergies Mother    Social History:   reports that she has never smoked. She has never used smokeless tobacco. She reports that  drinks alcohol. She  reports that she does not use illicit drugs.   REVIEW OF SYSTEMS - PERTINENT POSITIVES ONLY: No history of DVT. No prior abdominal surgery.no new changes to her review of systems.  Physical Exam:   Blood pressure 138/78, pulse 80, temperature 98.1 F (36.7 C), temperature source Temporal, resp. rate 15, height 5' 6" (1.676 m), weight 252 lb (114.306 kg). Body mass index is 40.69 kg/(m^2).  Gen:  WDWN white female NAD  Neurological: Alert and oriented to person, place, and time. Motor and sensory function is grossly intact  Head: Normocephalic and atraumatic.  Eyes: Conjunctivae are normal. Pupils are equal, round, and reactive to light. No scleral icterus.  Neck: Normal range of motion. Neck supple. No tracheal deviation or thyromegaly present.  Cardiovascular:  SR without murmurs or gallops.  No carotid bruits Respiratory: Effort normal.  No respiratory distress. No chest wall tenderness. Breath sounds normal.  No wheezes, rales or rhonchi.  Abdomen:  nontender GU: Musculoskeletal: Normal range of motion. Extremities are nontender. No cyanosis, edema or clubbing noted Lymphadenopathy: No cervical, preauricular, postauricular or axillary adenopathy is present Skin: Skin is warm and dry. No rash noted. No diaphoresis. No erythema. No pallor. Pscyh: Normal mood and affect. Behavior is normal. Judgment and thought content normal.   LABORATORY RESULTS: No results found for this or any previous visit (from   the past 48 hour(s)).  RADIOLOGY RESULTS: No results found.  Problem List: Patient Active Problem List   Diagnosis Date Noted  . OSA (obstructive sleep apnea) 01/18/2013  . Arthritis 11/18/2012    Assessment & Plan: BMI 43 with arthritis but no diabetes. Questionable obstructive sleep apnea. No history of GERD or heartburn. Upper GI was normal. She is ready for her upcoming laparoscopic adjustable gastric band. She may be able to go home on the same day. We discussed the procedure  and its limitations drawbacks etc. She is ready for laparoscopic adjustable gastric banding.    Matt B. Waylen Depaolo, MD, FACS  [facility] [phone] beeper [phone]  06/16/2013 11:35 AM     

## 2013-06-28 NOTE — Progress Notes (Signed)
Page into Jacki Cones to be made aware patient is here and need for discharge instructions

## 2013-06-28 NOTE — Op Note (Signed)
06/28/2013  Surgeon: Wenda Low, MD, FACS Asst:  Gaynelle Adu, MD, FACS  Procedure: Laparoscopic adjustable gastric banding with Apollo APS Lapband  Anes:  General  EBL:  Minimal  Description of Procedure  The patient was taken to OR # 11 and given general anesthesia.  After a prep with PCMX the patient was draped and a timeout performed.  Access to the abdomen was achieved with a 0 degree Optiview technique through the left upper quadrant.    Adhesions were minimal.  Ports were placed to the the right of the midline including a 15 trocar in  the right upper quadrant placed obliquely.  The Satira Mccallum was used to retract the left lateral segment and the peritoneum was incised along the left crus.   The EJ junction as assessed for a hiatus hernia and none was seen.  A balloon test was negative with 10 cc of air in the balloon.    The pars flaccida technique was utilized to insert the blunt "finger " dissector from right to left behind the stomach.  This created a target zone to pass the band passer.     The lapband APS  Had been previously flushed and was inserted through the 15 trocar.  It was placed in the tip of "the finger"  and pulled around behind the stomach.   The band was plicated with 3 sutures of SurgiDek secured with TyKnots.  The tubing was brought out through the lower incision on the right and connected to the port which had mesh sewn onto the back and was placed into the subcutaneous pocket.  The incisions were injected with Exparel and closed with 4-0 vicryl and Dermabond.     The patient was taken to the PACU in stable condition.    Matt B. Daphine Deutscher, MD, [facility], [place] [phone]

## 2013-06-29 ENCOUNTER — Encounter (HOSPITAL_COMMUNITY): Payer: Self-pay | Admitting: Surgery

## 2013-07-08 ENCOUNTER — Encounter: Payer: Self-pay | Admitting: *Deleted

## 2013-07-08 ENCOUNTER — Encounter: Payer: Federal, State, Local not specified - PPO | Attending: Surgery | Admitting: *Deleted

## 2013-07-08 DIAGNOSIS — Z713 Dietary counseling and surveillance: Secondary | ICD-10-CM | POA: Insufficient documentation

## 2013-07-08 NOTE — Patient Instructions (Addendum)
Patient to follow Phase 3A-Soft, High Protein Diet and follow-up at [facility] in 6 weeks for 2 months post-op nutrition visit for diet advancement. 

## 2013-07-08 NOTE — Progress Notes (Signed)
2 Week Post-Operative Nutrition F/U Appt start time: 0945 end time:  1045.  Patient was seen on 07/08/13 for Post[facility]ative [facility].   TANITA  BODY COMP RESULTS  06/09/13 07/08/13   BMI (kg/m^2) 41.1 38.5   Fat Mass (lbs) 124.0 114.5   Fat Free Mass (lbs) 130.5 124.0   Total Body Water (lbs) 95.5 91.0   The following the learning objective met the patient during this course:   Identifies Phase 3A (Soft, High Proteins) Dietary Goals and will begin from 2 weeks post-operatively to 2 months post-operatively   Identifies appropriate sources of fluids and proteins   States protein recommendations and appropriate sources post-operatively  Identifies the need for appropriate texture modifications, mastication, and bite sizes when consuming solids  Identifies appropriate multivitamin and calcium sources post-operatively  Describes the need for physical activity post-operatively and will follow MD recommendations  States when to call healthcare provider regarding medication questions or post-operative complications  Handouts given during class include:  Phase 3A: Soft, High Protein Diet Handout  Band Fill Guidelines Handout  Follow-Up Plan: Patient will follow-up at [facility] in 4 weeks for 6 week post-op nutrition visit for diet advancement per MD.

## 2013-07-19 NOTE — Patient Instructions (Signed)
Follow:  Pre-Op Diet per MD 2 weeks prior to surgery  Phase 2- Liquids (clear/full) 2 weeks after surgery  Vitamin/Mineral/Calcium guidelines for purchasing bariatric supplements  Exercise guidelines pre and post-op per MD  Follow-up at [facility] in 2 weeks post-op for diet advancement. Contact Phillip Maffei at [email or web address] or [phone] as needed with questions/concerns.   

## 2013-07-21 ENCOUNTER — Ambulatory Visit (INDEPENDENT_AMBULATORY_CARE_PROVIDER_SITE_OTHER): Payer: Federal, State, Local not specified - PPO | Admitting: Surgery

## 2013-07-21 ENCOUNTER — Encounter (INDEPENDENT_AMBULATORY_CARE_PROVIDER_SITE_OTHER): Payer: Self-pay | Admitting: Surgery

## 2013-07-21 VITALS — BP 122/76 | HR 68 | Temp 98.6°F | Resp 14 | Ht 66.0 in | Wt 233.6 lb

## 2013-07-21 DIAGNOSIS — Z9884 Bariatric surgery status: Secondary | ICD-10-CM | POA: Insufficient documentation

## 2013-07-21 NOTE — Progress Notes (Signed)
Monique Hamilton 42 y.o.  Body mass index is 37.72 kg/(m^2).  Patient Active Problem List   Diagnosis Date Noted  . Morbid obesity 06/16/2013  . OSA (obstructive sleep apnea) 01/18/2013  . Arthritis 11/18/2012    No Known Allergies  Past Surgical History  Procedure Laterality Date  . Knee surgery  12/2011    left  . Wisdom tooth extraction  2004  . Breath tek h pylori N/A 12/29/2012    Procedure: BREATH TEK H PYLORI;  Surgeon: Valarie Merino, MD;  Location: [facility];  Service: General;  Laterality: N/A;  . Laparoscopic gastric banding N/A 06/28/2013    Procedure: LAPAROSCOPIC GASTRIC BANDING;  Surgeon: Valarie Merino, MD;  Location: [facility];  Service: General;  Laterality: N/A;  . Mesh applied to lap port N/A 06/28/2013    Procedure: MESH APPLIED TO LAP PORT;  Surgeon: Valarie Merino, MD;  Location: [facility];  Service: General;  Laterality: N/A;   Neldon Labella, MD No diagnosis found.  Weight is down to 233 from 252. She is feeling some restriction. We discussed avoidance of sugars and sugar substitutes and her diet well she tries to lose weight. She is feeling better. Plan her first Laband fill in 3 weeks. Matt B. Daphine Deutscher, MD, [facility] [phone] beeper [phone]  07/21/2013 11:44 AM

## 2013-07-21 NOTE — Patient Instructions (Addendum)

## 2013-08-07 ENCOUNTER — Encounter (HOSPITAL_COMMUNITY): Payer: Self-pay | Admitting: Emergency Medicine

## 2013-08-07 ENCOUNTER — Emergency Department (HOSPITAL_COMMUNITY)
Admission: EM | Admit: 2013-08-07 | Discharge: 2013-08-07 | Disposition: A | Discharge status: Home / Self Care | Payer: Federal, State, Local not specified - PPO | Attending: Emergency Medicine | Admitting: Emergency Medicine

## 2013-08-07 DIAGNOSIS — IMO0002 Reserved for concepts with insufficient information to code with codable children: Secondary | ICD-10-CM

## 2013-08-07 DIAGNOSIS — Y9389 Activity, other specified: Secondary | ICD-10-CM | POA: Insufficient documentation

## 2013-08-07 DIAGNOSIS — R209 Unspecified disturbances of skin sensation: Secondary | ICD-10-CM | POA: Insufficient documentation

## 2013-08-07 DIAGNOSIS — W278XXA Contact with other nonpowered hand tool, initial encounter: Secondary | ICD-10-CM | POA: Insufficient documentation

## 2013-08-07 DIAGNOSIS — Z8719 Personal history of other diseases of the digestive system: Secondary | ICD-10-CM | POA: Insufficient documentation

## 2013-08-07 DIAGNOSIS — Y929 Unspecified place or not applicable: Secondary | ICD-10-CM | POA: Insufficient documentation

## 2013-08-07 DIAGNOSIS — Z8659 Personal history of other mental and behavioral disorders: Secondary | ICD-10-CM | POA: Insufficient documentation

## 2013-08-07 DIAGNOSIS — Z87410 Personal history of cervical dysplasia: Secondary | ICD-10-CM | POA: Insufficient documentation

## 2013-08-07 DIAGNOSIS — Z23 Encounter for immunization: Secondary | ICD-10-CM | POA: Insufficient documentation

## 2013-08-07 DIAGNOSIS — M129 Arthropathy, unspecified: Secondary | ICD-10-CM | POA: Insufficient documentation

## 2013-08-07 DIAGNOSIS — S61509A Unspecified open wound of unspecified wrist, initial encounter: Secondary | ICD-10-CM | POA: Insufficient documentation

## 2013-08-07 MED ORDER — TETANUS-DIPHTH-ACELL PERTUSSIS 5-2.5-18.5 LF-MCG/0.5 IM SUSP
0.5000 mL | Freq: Once | INTRAMUSCULAR | Status: AC
  Administered 2013-08-07: 0.5 mL via INTRAMUSCULAR
  Filled 2013-08-07: qty 0.5

## 2013-08-07 NOTE — ED Provider Notes (Signed)
Medical screening examination/treatment/procedure(s) were performed by non-physician practitioner and as supervising physician I was immediately available for consultation/collaboration.   Rolan Bucco, MD 08/07/13 [phone]

## 2013-08-07 NOTE — ED Provider Notes (Signed)
CSN: [id number]     Arrival date & time 08/07/13  1818 History  This chart was scribed for non-physician practitioner Magnus Sinning, PA-C, working with Rolan Bucco, MD by Dorothey Baseman, ED Scribe. This patient was seen in room TR04C/TR04C and the patient's care was started at 7:42 PM.    Chief Complaint  Patient presents with  . Extremity Laceration   The history is provided by the patient. No language interpreter was used.   HPI Comments: Monique Hamilton is a 42 y.o. female who presents to the Emergency Department complaining of a laceration to the left wrist that occurred PTA with a box-cutter. Patient states that she applied pressure to the area with paper towels and the bleeding is well-controlled at this time. Patient reports some mild associated pain to the area and mild numbness to the base of the left thumb. Patient reports that she does not know if her tetanus vaccination is up to date. Patient reports a history of arthritis and hepatitis.   Past Medical History  Diagnosis Date  . Arthritis   . Morbid obesity   . Headache(784.0)     occasional migraines  . Hepatitis 1994    unsure of what type ( never showed up again)  . Nocturia   . Urgency of urination   . Sleep apnea     "mild" does not need c-pap  . Depression     has SAD - seasonal only no meds  . History of cervical dysplasia    Past Surgical History  Procedure Laterality Date  . Knee surgery  12/2011    left  . Wisdom tooth extraction  2004  . Breath tek h pylori N/A 12/29/2012    Procedure: BREATH TEK H PYLORI;  Surgeon: Valarie Merino, MD;  Location: [facility];  Service: General;  Laterality: N/A;  . Laparoscopic gastric banding N/A 06/28/2013    Procedure: LAPAROSCOPIC GASTRIC BANDING;  Surgeon: Valarie Merino, MD;  Location: [facility];  Service: General;  Laterality: N/A;  . Mesh applied to lap port N/A 06/28/2013    Procedure: MESH APPLIED TO LAP PORT;  Surgeon: Valarie Merino, MD;  Location: [facility];  Service: General;  Laterality: N/A;   Family History  Problem Relation Age of Onset  . Breast cancer Mother   . Lung cancer Paternal Grandfather   . Cancer Maternal Grandmother   . Allergies Mother    History  Substance Use Topics  . Smoking status: Never Smoker   . Smokeless tobacco: Never Used  . Alcohol Use: Yes     Comment: 1-3 times a week   OB History   Grav Para Term Preterm Abortions TAB SAB Ect Mult Living                 Review of Systems  A complete 10 system review of systems was obtained and all systems are negative except as noted in the HPI and PMH.   Allergies  Review of patient's allergies indicates no known allergies.  Home Medications   Current Outpatient Rx  Name  Route  Sig  Dispense  Refill  . acetaminophen (TYLENOL) 325 MG tablet   Oral   Take 650 mg by mouth every 6 (six) hours as needed for pain.         Marland Kitchen ibuprofen (ADVIL,MOTRIN) 200 MG tablet   Oral   Take 600 mg by mouth every 6 (six) hours as needed for pain.  Triage Vitals: BP 173/101  Pulse 80  Temp(Src) 97.9 F (36.6 C) (Oral)  Resp 22  SpO2 100%  Physical Exam  Nursing note and vitals reviewed. Constitutional: She is oriented to person, place, and time. She appears well-developed and well-nourished. No distress.  HENT:  Head: Normocephalic and atraumatic.  Eyes: Conjunctivae are normal.  Neck: Normal range of motion. Neck supple.  Cardiovascular: Normal rate, regular rhythm and normal heart sounds.   Pulmonary/Chest: Effort normal and breath sounds normal. No respiratory distress.  Abdominal: She exhibits no distension.  Musculoskeletal: Normal range of motion.  2+ radial pulse.  Full ROM of the right wrist.  Neurological: She is alert and oriented to person, place, and time.  5/5 muscle strength of the left wrist. Distal sensation of all fingers of the left hand is intact.   Skin: Skin is warm and dry.  1 cm superficial linear laceration to the volar  aspect of the left wrist without apparent tendon involvement.   Psychiatric: She has a normal mood and affect. Her behavior is normal.    ED Course  Procedures (including critical care time)  Medications  TDaP (BOOSTRIX) injection 0.5 mL (0.5 mLs Intramuscular Given 08/07/13 2014)   DIAGNOSTIC STUDIES: Oxygen Saturation is 100% on room air, normal by my interpretation.    COORDINATION OF CARE: 7:47 PM- Discussed that there does not appear to be any tendon or nerve damage. Will repair the laceration with Derma bond and order a tetanus vaccination. Discussed treatment plan with patient at bedside and patient verbalized agreement.   LACERATION REPAIR PROCEDURE NOTE The patient's identification was confirmed and consent was obtained. This procedure was performed by Magnus Sinning, PA-C, at 8:15 PM. Site: volar aspect of left wrist Sterile procedures observed Length: 1 cm Technique: Derma Bond Complexity: simple Tetanus ordered  Site irrigated with NS, explored without evidence of foreign body, wound well approximated.  Patient tolerated procedure well without complications. Instructions for care discussed verbally and patient provided with additional written instructions for homecare and f/u.   Labs Review Labs Reviewed - No data to display Imaging Review No results found.  EKG Interpretation   None       MDM  No diagnosis found. Patient presenting with a laceration to the volar aspect of the wrist. No apparent tendon involvement.  Patient is neurovascularly intact.  Laceration superficial and repaired with Dermabond.   I personally performed the services described in this documentation, which was scribed in my presence. The recorded information has been reviewed and is accurate.     Pascal Lux Trout, PA-C 08/07/13 2037

## 2013-08-07 NOTE — ED Notes (Signed)
Cutting cardboard with box cutter and accidentally cut left wrist. Small laceration, bleeding controlled. Cannot visualize depth. States cutter went into arm. Able to move all fingers, but it hurts at wrist to move them. Tingling in fingers.

## 2013-08-07 NOTE — ED Notes (Signed)
Laceration cleansed with Saf-clens; dermabond applied by PA, Heather. Patient tolerated well.

## 2013-08-09 ENCOUNTER — Encounter: Payer: Self-pay | Admitting: *Deleted

## 2013-08-09 ENCOUNTER — Encounter: Payer: Federal, State, Local not specified - PPO | Attending: Surgery | Admitting: *Deleted

## 2013-08-09 DIAGNOSIS — Z713 Dietary counseling and surveillance: Secondary | ICD-10-CM | POA: Insufficient documentation

## 2013-08-09 NOTE — Patient Instructions (Signed)
Goals:  Follow Phase 3B: High Protein + Non-Starchy Vegetables  Eat 3-6 small meals/snacks, every 3-5 hrs  Increase lean protein foods to meet 60-80g goal  Increase fluid intake to 64oz +  Avoid drinking 15 minutes before, during and 30 minutes after eating  Aim for >30 min of physical activity daily  Return in 6 weeks.

## 2013-08-09 NOTE — Progress Notes (Signed)
Follow-up visit:  6 Weeks Post-Operative LAGB Surgery  Medical Nutrition Therapy:  Appt start time: 0810   End time:  0845.  Primary concerns today: Post-operative Bariatric Surgery Nutrition Management.  Surgery date: 06/28/13 Surgery type: LAGB [facility] weight at [facility]: 267 lbs (11/25/12) Highest patient-reported weight: 274 lbs (10/2012)  Weight today: 227.5 lbs Weight change: 11.0 lbs Total weight lost: 39.5 lbs (Total: 46.5 lbs)  TANITA  BODY COMP RESULTS  06/09/13 07/08/13 08/09/13   BMI (kg/m^2) 41.1 38.5 36.7   Fat Mass (lbs) 124.0 114.5 104.0   Fat Free Mass (lbs) 130.5 124.0 123.5   Total Body Water (lbs) 95.5 91.0 90.5   24-hr recall: B (6-6:30 AM): 1/2 decaf coffee; Atkins shake (15g) Snk (9:30-10 AM): 2-3 oz cheese (15g)  L (PM): 3-4 oz grilled chicken OR black beans with light sour cream, little cheese Snk (PM): cheese stick OR protein shake D (PM): 5-6 oz hamburger Snk (PM): NONE or 1-2 oz cheese  Fluid intake: 40-50 oz Estimated total protein intake:  60-70g  Medications: No changes.  States her discharge papers say she can take ibuprofen. Supplementation: Taking as directed  Using straws: No Drinking while eating: No Hair loss: No Carbonated beverages: No N/V/D/C: No Last Lap-Band fill: None yet  Recent physical activity:  Running with kids to prepare for 3K @ 2x/week; other activities roller bladeing, additional walking  Progress Towards Goal(s):  In progress.  Handouts given during visit include:  Phase 3B: High Protein + Non-Starchy Vegetables   Nutritional Diagnosis:  [place]-3.3 Overweight/obesity related to past poor dietary habits and physical inactivity as evidenced by patient w/ recent LAGB surgery following dietary guidelines for continued weight loss.    Intervention:  Nutrition education/diet advancement.  Monitoring/Evaluation:  Dietary intake, exercise, lap band fills, and body weight. Follow up in 6 week for 3 month post-op visit.

## 2013-08-25 ENCOUNTER — Other Ambulatory Visit: Payer: Self-pay

## 2013-08-26 ENCOUNTER — Encounter (INDEPENDENT_AMBULATORY_CARE_PROVIDER_SITE_OTHER): Payer: Federal, State, Local not specified - PPO | Admitting: Surgery

## 2013-09-02 ENCOUNTER — Encounter (INDEPENDENT_AMBULATORY_CARE_PROVIDER_SITE_OTHER): Payer: Federal, State, Local not specified - PPO | Admitting: Surgery

## 2013-09-06 ENCOUNTER — Encounter (INDEPENDENT_AMBULATORY_CARE_PROVIDER_SITE_OTHER): Payer: Self-pay | Admitting: Surgery

## 2013-09-20 ENCOUNTER — Ambulatory Visit: Payer: Federal, State, Local not specified - PPO | Admitting: *Deleted

## 2013-09-29 ENCOUNTER — Encounter: Payer: Federal, State, Local not specified - PPO | Attending: Surgery | Admitting: Dietician

## 2013-09-29 DIAGNOSIS — Z713 Dietary counseling and surveillance: Secondary | ICD-10-CM | POA: Insufficient documentation

## 2013-09-29 NOTE — Progress Notes (Signed)
Follow-up visit:  12 Weeks Post-Operative LAGB Surgery  Medical Nutrition Therapy:  Appt start time: 0810   End time:  0845.  Primary concerns today: Post-operative Bariatric Surgery Nutrition Management. Mellany returns today with a 2 lbs weight loss. Frustrated that she hasn't lost more and feeling hungry. Tolerating everything as long as she chews food well.    Surgery date: 06/28/13 Surgery type: LAGB Start weight at [facility]: 267 lbs (11/25/12) Highest patient-reported weight: 274 lbs (10/2012)  Weight today: 225.5 lbs Weight change: 2.0 lbs, 7 lbs fat loss Total weight lost: 48.5 lbs (Total: 46.5 lbs)  TANITA  BODY COMP RESULTS  06/09/13 07/08/13 08/09/13 09/29/13   BMI (kg/m^2) 41.1 38.5 36.7 36.4   Fat Mass (lbs) 124.0 114.5 104.0 97.0   Fat Free Mass (lbs) 130.5 124.0 123.5 128.5   Total Body Water (lbs) 95.5 91.0 90.5 94.0   24-hr recall: B (6-6:30 AM): 1/2 decaf coffee; Atkins shake (15g) Or egg and bacon or low sugar oatmeal Snk (9:30-10 AM): 2-3 oz cheese (15g)  L (PM): 5-6 oz protein with vegetable Snk (PM): cheese stick OR protein shake D (PM): 5-6 oz protein with vegetable Snk (PM): NONE or cheese  Fluid intake: 40-50 oz decaf coffee, protein shake, water, or unsweet tea Estimated total protein intake:  60-70g  Medications: No changes.  States her discharge papers say she can take ibuprofen. Supplementation: Taking as directed  Using straws: No Drinking while eating: No Hair loss: No Carbonated beverages: No N/V/D/C: No Last Lap-Band fill: Fill scheduled for next week   Recent physical activity:  Doing the Belt program 3 x week   Progress Towards Goal(s):  In progress.  Handouts given during visit include:  Phase 3B: High Protein + Non-Starchy Vegetables   Nutritional Diagnosis:  [place]-3.3 Overweight/obesity related to past poor dietary habits and physical inactivity as evidenced by patient w/ recent LAGB surgery following dietary guidelines for continued  weight loss.    Intervention:  Nutrition education/diet advancement.  Monitoring/Evaluation:  Dietary intake, exercise, lap band fills, and body weight. Follow up in 3 months for 6 month post-op visit.

## 2013-09-29 NOTE — Patient Instructions (Addendum)
Goals:  Follow Phase 3B: High Protein + Non-Starchy Vegetables  Eat 3-6 small meals/snacks, every 3-5 hrs  Increase lean protein foods to meet 60-80g goal  Increase fluid intake to 64oz +  Avoid drinking 15 minutes before, during and 30 minutes after eating  Aim for >30 min of physical activity daily  Add more vegetables if you feel like you need more vegetables at meal times  

## 2013-10-07 ENCOUNTER — Ambulatory Visit (INDEPENDENT_AMBULATORY_CARE_PROVIDER_SITE_OTHER): Payer: Federal, State, Local not specified - PPO | Admitting: Surgery

## 2013-10-07 ENCOUNTER — Encounter (INDEPENDENT_AMBULATORY_CARE_PROVIDER_SITE_OTHER): Payer: Self-pay | Admitting: Surgery

## 2013-10-07 VITALS — BP 128/76 | HR 68 | Temp 98.0°F | Resp 18 | Ht 66.0 in | Wt 222.6 lb

## 2013-10-07 DIAGNOSIS — Z4651 Encounter for fitting and adjustment of gastric lap band: Secondary | ICD-10-CM

## 2013-10-07 NOTE — Patient Instructions (Signed)

## 2013-10-07 NOTE — Progress Notes (Signed)
Lapband Fill Encounter Problem List:   Patient Active Problem List   Diagnosis Date Noted  . 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 Morbid obesity 06/16/2013  . OSA (obstructive sleep apnea) 01/18/2013  . Arthritis 11/18/2012    Monique Hamilton Body mass index is 35.95 kg/(m^2). Weight loss since surgery  30  Having regurgitation?:  no  Feel that they need a fill?  yes  Nocturnal reflux?  no  Amount of fill  0.5     Instructions given and weight loss goals discussed.    Will give her a few fill since this is her first and she is almost out of the global period.    Matt B. Daphine Deutscher, MD, FACS

## 2013-10-11 ENCOUNTER — Other Ambulatory Visit: Payer: Self-pay | Admitting: Obstetrics & Gynecology

## 2013-10-11 DIAGNOSIS — Z1231 Encounter for screening mammogram for malignant neoplasm of breast: Secondary | ICD-10-CM

## 2013-11-03 ENCOUNTER — Encounter: Payer: Self-pay | Admitting: Obstetrics & Gynecology

## 2013-11-03 ENCOUNTER — Ambulatory Visit (INDEPENDENT_AMBULATORY_CARE_PROVIDER_SITE_OTHER): Payer: Federal, State, Local not specified - PPO | Admitting: Obstetrics & Gynecology

## 2013-11-03 VITALS — BP 163/83 | HR 58 | Temp 98.7°F | Ht 66.0 in | Wt 220.0 lb

## 2013-11-03 DIAGNOSIS — IMO0001 Reserved for inherently not codable concepts without codable children: Secondary | ICD-10-CM

## 2013-11-03 DIAGNOSIS — Z01419 Encounter for gynecological examination (general) (routine) without abnormal findings: Secondary | ICD-10-CM

## 2013-11-03 LAB — POCT URINALYSIS DIPSTICK
BILIRUBIN UA: NEGATIVE
Blood, UA: NEGATIVE
GLUCOSE UA: NEGATIVE
Ketones, UA: NEGATIVE
LEUKOCYTES UA: NEGATIVE
NITRITE UA: NEGATIVE
Protein, UA: NEGATIVE
Spec Grav, UA: <=1.005
UROBILINOGEN UA: NEGATIVE
pH, UA: 8

## 2013-11-03 NOTE — Progress Notes (Signed)
Subjective:     Monique Hamilton is a 43 y.o. female here for a routine exam.  Current complaints: denies.  Personal health questionnaire reviewed: yes.   Gynecologic History Patient's last menstrual period was 10/20/2013. Contraception: none partner has vasectomy Last Pap: 2012. Results were: normal Last mammogram: 10/2012. Results were: normal  Obstetric History OB History  No data available     The following portions of the patient's history were reviewed and updated as appropriate: allergies, current medications, past family history, past medical history, past social history, past surgical history and problem list.  Review of Systems Pertinent items are noted in HPI.    Objective:      General appearance: alert Breasts: normal appearance, no masses or tenderness Abdomen: soft, non-tender; bowel sounds normal; no masses,  no organomegaly Pelvic: cervix normal in appearance, external genitalia normal, no adnexal masses or tenderness, uterus normal size, shape, and consistency and vagina normal without discharge       Assessment:    Healthy female exam.    Plan:    Return prn

## 2013-11-04 LAB — PAP IG AND HPV HIGH-RISK: HPV DNA HIGH RISK: NOT DETECTED

## 2013-11-05 NOTE — Patient Instructions (Signed)

## 2013-11-07 ENCOUNTER — Ambulatory Visit (HOSPITAL_COMMUNITY)
Admission: RE | Admit: 2013-11-07 | Discharge: 2013-11-07 | Disposition: A | Discharge status: Home / Self Care | Payer: Federal, State, Local not specified - PPO | Source: Ambulatory Visit | Attending: Obstetrics & Gynecology | Admitting: Obstetrics & Gynecology

## 2013-11-07 DIAGNOSIS — Z1231 Encounter for screening mammogram for malignant neoplasm of breast: Secondary | ICD-10-CM | POA: Insufficient documentation

## 2013-11-17 ENCOUNTER — Ambulatory Visit (INDEPENDENT_AMBULATORY_CARE_PROVIDER_SITE_OTHER): Payer: Federal, State, Local not specified - PPO | Admitting: Surgery

## 2013-11-17 ENCOUNTER — Encounter (INDEPENDENT_AMBULATORY_CARE_PROVIDER_SITE_OTHER): Payer: Self-pay | Admitting: Surgery

## 2013-11-17 VITALS — BP 128/82 | HR 66 | Temp 97.0°F | Resp 16 | Ht 66.0 in | Wt 221.2 lb

## 2013-11-17 DIAGNOSIS — Z4651 Encounter for fitting and adjustment of gastric lap band: Secondary | ICD-10-CM

## 2013-11-17 NOTE — Progress Notes (Signed)
Lapband Fill Encounter Problem List:   Patient Active Problem List   Diagnosis Date Noted  . Fitting and adjustment of gastric lap band 10/07/2013  . 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 Morbid obesity 06/16/2013  . OSA (obstructive sleep apnea) 01/18/2013  . Arthritis 11/18/2012    Cyndia Skeeters Body mass index is 35.72 kg/(m^2). Weight loss since surgery  30.8  Having regurgitation?:  no  Feel that they need a fill?  yes  Nocturnal reflux?  no  Amount of fill  1     Instructions given and weight loss goals discussed.    She will be finishing the BELT -program this next month.  Will see her back in 4 weeks.  This was only her second fill.  Will keep as PO visit  Matt B. Hassell Done, MD, FACS

## 2013-11-17 NOTE — Patient Instructions (Signed)

## 2013-11-18 ENCOUNTER — Encounter (INDEPENDENT_AMBULATORY_CARE_PROVIDER_SITE_OTHER): Payer: Federal, State, Local not specified - PPO | Admitting: Surgery

## 2013-12-15 ENCOUNTER — Encounter (INDEPENDENT_AMBULATORY_CARE_PROVIDER_SITE_OTHER): Payer: Federal, State, Local not specified - PPO | Admitting: Surgery

## 2013-12-28 ENCOUNTER — Encounter: Payer: Federal, State, Local not specified - PPO | Attending: Surgery | Admitting: Dietician

## 2013-12-28 DIAGNOSIS — Z713 Dietary counseling and surveillance: Secondary | ICD-10-CM | POA: Insufficient documentation

## 2013-12-28 NOTE — Patient Instructions (Signed)
Goals:  Follow Phase 3B: High Protein + Non-Starchy Vegetables  Eat 3-6 small meals/snacks, every 3-5 hrs  Increase lean protein foods to meet 60-80g goal  Increase fluid intake to 64oz +  Avoid drinking 15 minutes before, during and 30 minutes after eating  Aim for >30 min of physical activity daily  Add more vegetables if you feel like you need more vegetables at meal times

## 2013-12-28 NOTE — Progress Notes (Signed)
.  Follow-up visit:  12 Weeks Post-Operative LAGB Surgery  Medical Nutrition Therapy:  Appt start time: 1540   End time:  1000.  Primary concerns today: Post-operative Bariatric Surgery Nutrition Management. Sarabeth returns today with a 2 lbs weight loss. Still feeling hungry (in the yellow zone). Learning that there are some foods that she can't eat at all or in the morning.   Surgery date: 06/28/13 Surgery type: LAGB Start weight at [facility]: 267 lbs (11/25/12) Highest patient-reported weight: 274 lbs (10/2012)  Weight today: 225.5 lbs 217.0 Weight change: 8.5 lbs, 7 lbs fat loss Total weight lost: 48.5 lbs (Total: 46.5 lbs)  TANITA  BODY COMP RESULTS  06/09/13 07/08/13 08/09/13 09/29/13 12/28/13   BMI (kg/m^2) 41.1 38.5 36.7 36.4 35.0   Fat Mass (lbs) 124.0 114.5 104.0 97.0 93.5   Fat Free Mass (lbs) 130.5 124.0 123.5 128.5 123.5   Total Body Water (lbs) 95.5 91.0 90.5 94.0 90.5   24-hr recall: B (6-6:30 AM): 1/2 decaf coffee; Atkins shake (15g) Or egg and bacon or low sugar oatmeal Snk (9:30-10 AM): 2-3 oz cheese (15g) or Atkins bar L (PM): 5-6 oz protein with vegetable Snk (PM):none D (PM): 5-6 oz protein with vegetable with carbs (pizza rare) Snk (PM): cheese or pork rinds   Fluid intake: 40-50 oz (2-3 cups) decaf coffee, protein shake, water, or unsweet tea Estimated total protein intake:  60-70g  Medications: No changes.   Supplementation: Taking as directed  Using straws: No Drinking while eating: Not usally Hair loss: a little bit Carbonated beverages: No N/V/D/C: No Last Lap-Band fill: 1 cc on 11/17/2013, scheduled to get one next  Recent physical activity:  Walking or Wii Active and will start doing a running program tomorrow with family (4-5 x week)  Progress Towards Goal(s):  In progress.    Nutritional Diagnosis:  [place]-3.3 Overweight/obesity related to past poor dietary habits and physical inactivity as evidenced by patient w/ recent LAGB surgery following dietary  guidelines for continued weight loss.    Intervention:  Nutrition education/diet advancement.  Monitoring/Evaluation:  Dietary intake, exercise, lap band fills, and body weight. Follow up in 3 months for 9 month post-op visit.

## 2013-12-29 ENCOUNTER — Ambulatory Visit (INDEPENDENT_AMBULATORY_CARE_PROVIDER_SITE_OTHER): Payer: Federal, State, Local not specified - PPO | Admitting: Surgery

## 2013-12-29 ENCOUNTER — Encounter (INDEPENDENT_AMBULATORY_CARE_PROVIDER_SITE_OTHER): Payer: Self-pay | Admitting: Surgery

## 2013-12-29 VITALS — BP 122/80 | HR 60 | Temp 98.5°F | Resp 20 | Ht 66.0 in | Wt 218.0 lb

## 2013-12-29 DIAGNOSIS — Z9884 Bariatric surgery status: Secondary | ICD-10-CM

## 2013-12-29 NOTE — Patient Instructions (Signed)

## 2013-12-29 NOTE — Progress Notes (Signed)
Lapband Fill Encounter Problem List:   Patient Active Problem List   Diagnosis Date Noted  . Fitting and adjustment of gastric lap band 10/07/2013  . 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 Morbid obesity 06/16/2013  . OSA (obstructive sleep apnea) 01/18/2013  . Arthritis 11/18/2012    Cyndia Skeeters Body mass index is 35.2 kg/(m^2). Weight loss since surgery  34  Having regurgitation?:  no  Feel that they need a fill?  yes  Nocturnal reflux?  no  Amount of fill  0.6     Instructions given and weight loss goals discussed.    We discussed the lapband as tool.    Matt B. Hassell Done, MD, FACS

## 2014-02-16 ENCOUNTER — Encounter (INDEPENDENT_AMBULATORY_CARE_PROVIDER_SITE_OTHER): Payer: Self-pay | Admitting: Surgery

## 2014-02-16 ENCOUNTER — Ambulatory Visit (INDEPENDENT_AMBULATORY_CARE_PROVIDER_SITE_OTHER): Payer: Federal, State, Local not specified - PPO | Admitting: Surgery

## 2014-02-16 VITALS — BP 118/82 | HR 66 | Temp 99.4°F | Resp 16 | Ht 66.0 in | Wt 205.2 lb

## 2014-02-16 DIAGNOSIS — Z9884 Bariatric surgery status: Secondary | ICD-10-CM

## 2014-02-16 NOTE — Patient Instructions (Signed)
You are in the "green zone"--use it to control pace and quantity of meals

## 2014-02-16 NOTE — Progress Notes (Signed)
Lapband Fill Encounter Problem List:   Patient Active Problem List   Diagnosis Date Noted  . Fitting and adjustment of gastric lap band 10/07/2013  . 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 Morbid obesity 06/16/2013  . OSA (obstructive sleep apnea) 01/18/2013  . Arthritis 11/18/2012    Cyndia Skeeters Body mass index is 33.14 kg/(m^2). Weight loss since surgery  47 lbs  Having regurgitation?:  no  Feel that they need a fill?  unsure  Nocturnal reflux?  no  Amount of fill  0     Instructions given and weight loss goals discussed.    I think that she is in the green zone.  She is feeling restriction but not having reflux problems  Matt B. Hassell Done, MD, FACS

## 2014-03-30 ENCOUNTER — Ambulatory Visit (INDEPENDENT_AMBULATORY_CARE_PROVIDER_SITE_OTHER): Payer: Federal, State, Local not specified - PPO | Admitting: Surgery

## 2014-03-30 ENCOUNTER — Encounter (INDEPENDENT_AMBULATORY_CARE_PROVIDER_SITE_OTHER): Payer: Self-pay | Admitting: Surgery

## 2014-03-30 VITALS — BP 134/74 | HR 76 | Temp 97.5°F | Ht 66.0 in | Wt 198.0 lb

## 2014-03-30 DIAGNOSIS — Z9884 Bariatric surgery status: Secondary | ICD-10-CM

## 2014-03-30 NOTE — Patient Instructions (Signed)
Thanks for your patience.  If you need further assistance after leaving the office, please call our office and speak with a [facility] nurse.  [phone].  If you want to leave a message for Dr. Arijana Narayan, please call his office phone at [phone]. 

## 2014-03-30 NOTE — Progress Notes (Signed)
Lapband Fill Encounter Problem List:   Patient Active Problem List   Diagnosis Date Noted  . Fitting and adjustment of gastric lap band 10/07/2013  . 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 Morbid obesity 06/16/2013  . OSA (obstructive sleep apnea) 01/18/2013  . Arthritis 11/18/2012    Cyndia Skeeters Body mass index is 31.97 kg/(m^2). Weight loss since surgery  54 lbs  Having regurgitation?:  no  Feel that they need a fill?  Was uncertain  Nocturnal reflux?  no  Amount of fill  0     Instructions given and weight loss goals discussed.    She is restricted.  She is making good food choices and is seeing Clarise Cruz.  With fluid fluctuations she had periods of tightness and less tight.  I think that we should observe her at present.  Will see back in 3 months.   Matt B. Hassell Done, MD, FACS

## 2014-04-03 ENCOUNTER — Ambulatory Visit: Payer: Federal, State, Local not specified - PPO | Admitting: Dietician

## 2014-04-10 ENCOUNTER — Ambulatory Visit (INDEPENDENT_AMBULATORY_CARE_PROVIDER_SITE_OTHER): Payer: Federal, State, Local not specified - PPO | Admitting: Obstetrics & Gynecology

## 2014-04-10 ENCOUNTER — Other Ambulatory Visit: Payer: Self-pay | Admitting: Obstetrics & Gynecology

## 2014-04-10 ENCOUNTER — Encounter: Payer: Self-pay | Admitting: Obstetrics & Gynecology

## 2014-04-10 VITALS — BP 124/84 | HR 68 | Temp 99.3°F | Ht 66.5 in | Wt 203.0 lb

## 2014-04-10 DIAGNOSIS — N939 Abnormal uterine and vaginal bleeding, unspecified: Secondary | ICD-10-CM

## 2014-04-10 DIAGNOSIS — N926 Irregular menstruation, unspecified: Secondary | ICD-10-CM

## 2014-04-10 LAB — COMPREHENSIVE METABOLIC PANEL
ALT: 11 U/L (ref 0–35)
AST: 14 U/L (ref 0–37)
Albumin: 4.1 g/dL (ref 3.5–5.2)
Alkaline Phosphatase: 38 U/L — ABNORMAL LOW (ref 39–117)
BUN: 15 mg/dL (ref 6–23)
CALCIUM: 10 mg/dL (ref 8.4–10.5)
CHLORIDE: 104 meq/L (ref 96–112)
CO2: 27 meq/L (ref 19–32)
CREATININE: 0.71 mg/dL (ref 0.50–1.10)
GLUCOSE: 77 mg/dL (ref 70–99)
Potassium: 4.8 mEq/L (ref 3.5–5.3)
Sodium: 137 mEq/L (ref 135–145)
Total Bilirubin: 0.7 mg/dL (ref 0.2–1.2)
Total Protein: 6 g/dL (ref 6.0–8.3)

## 2014-04-10 LAB — CBC
HCT: 38 % (ref 36.0–46.0)
HEMOGLOBIN: 12.9 g/dL (ref 12.0–15.0)
MCH: 30.4 pg (ref 26.0–34.0)
MCHC: 33.9 g/dL (ref 30.0–36.0)
MCV: 89.4 fL (ref 78.0–100.0)
Platelets: 246 10*3/uL (ref 150–400)
RBC: 4.25 MIL/uL (ref 3.87–5.11)
RDW: 13.3 % (ref 11.5–15.5)
WBC: 5.4 10*3/uL (ref 4.0–10.5)

## 2014-04-10 MED ORDER — ETHYNODIOL DIAC-ETH ESTRADIOL 1-35 MG-MCG PO TABS: 1.0000 | ORAL_TABLET | Freq: Every day | ORAL | Status: DC

## 2014-04-10 NOTE — Patient Instructions (Signed)
Abnormal Uterine Bleeding Abnormal uterine bleeding can affect women at various stages in life, including teenagers, women in their reproductive years, pregnant women, and women who have reached menopause. Several kinds of uterine bleeding are considered abnormal, including:  Bleeding or spotting between periods.   Bleeding after sexual intercourse.   Bleeding that is heavier or more than normal.   Periods that last longer than usual.  Bleeding after menopause.  Many cases of abnormal uterine bleeding are minor and simple to treat, while others are more serious. Any type of abnormal bleeding should be evaluated by your health care Kyliegh Jester. Treatment will depend on the cause of the bleeding. HOME CARE INSTRUCTIONS Monitor your condition for any changes. The following actions may help to alleviate any discomfort you are experiencing:  Avoid the use of tampons and douches as directed by your health care Delmus Warwick.  Change your pads frequently. You should get regular pelvic exams and Pap tests. Keep all follow-up appointments for diagnostic tests as directed by your health care Indya Oliveria.  SEEK MEDICAL CARE IF:   Your bleeding lasts more than 1 week.   You feel dizzy at times.  SEEK IMMEDIATE MEDICAL CARE IF:   You pass out.   You are changing pads every 15 to 30 minutes.   You have abdominal pain.  You have a fever.   You become sweaty or weak.   You are passing large blood clots from the vagina.   You start to feel nauseous and vomit. MAKE SURE YOU:   Understand these instructions.  Will watch your condition.  Will get help right away if you are not doing well or get worse. Document Released: 10/06/2005 Document Revised: 10/11/2013 Document Reviewed: 05/05/2013 ExitCare Patient Information 2015 [employer]. This information is not intended to replace advice given to you by your health care Mina Carlisi. Make sure you discuss any questions you have with your  health care Ardeth Repetto.  

## 2014-04-10 NOTE — Progress Notes (Signed)
Monique Hamilton is a 43 y.o.who presents for irregular menses. Patient's last menstrual period was 01/23/2014.  Periods are 12 day cycle, lasting 5 days. Dysmenorrhea: last menses described as severe. Cyclic symptoms include: moodiness. Current contraception: vasectomy.  Patient Active Problem List   Diagnosis Date Noted  . Fitting and adjustment of gastric lap band 10/07/2013  . 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 Morbid obesity 06/16/2013  . OSA (obstructive sleep apnea) 01/18/2013  . Arthritis 11/18/2012   Past Medical History  Diagnosis Date  . Arthritis   . Morbid obesity   . Headache(784.0)     occasional migraines  . Hepatitis 1994    unsure of what type ( never showed up again)  . Nocturia   . Urgency of urination   . Sleep apnea     "mild" does not need c-pap  . Depression     has SAD - seasonal only no meds  . History of cervical dysplasia     Past Surgical History  Procedure Laterality Date  . Knee surgery  12/2011    left  . Wisdom tooth extraction  2004  . Breath tek h pylori N/A 12/29/2012    Procedure: BREATH TEK H PYLORI;  Surgeon: Pedro Earls, MD;  Location: [facility];  Service: General;  Laterality: N/A;  . Laparoscopic gastric banding N/A 06/28/2013    Procedure: LAPAROSCOPIC GASTRIC BANDING;  Surgeon: Pedro Earls, MD;  Location: [facility];  Service: General;  Laterality: N/A;  . Mesh applied to lap port N/A 06/28/2013    Procedure: MESH APPLIED TO LAP PORT;  Surgeon: Pedro Earls, MD;  Location: [facility];  Service: General;  Laterality: N/A;    Current outpatient prescriptions:ibuprofen (ADVIL,MOTRIN) 200 MG tablet, Take 600 mg by mouth every 6 (six) hours as needed for pain. , Disp: , Rfl: ;  Multiple Vitamin (MULTI VITAMIN DAILY PO), Take 1 tablet by mouth daily., Disp: , Rfl: ;  ethynodiol-ethinyl estradiol (ZOVIA 1/35E, 28,) 1-35 MG-MCG tablet, Take 1 tablet by mouth daily., Disp: 1 Package, Rfl: 11 Allergies  Allergen Reactions  . Lemon  Flavor Other (See Comments)    headache    History  Substance Use Topics  . Smoking status: Never Smoker   . Smokeless tobacco: Never Used  . Alcohol Use: Yes     Comment: occasional    Family History  Problem Relation Age of Onset  . Breast cancer Mother   . Lung cancer Paternal Grandfather   . Cancer Maternal Grandmother   . Allergies Mother      Review of Systems Constitutional: negative for fatigue and positive for weight loss Respiratory: negative for cough and wheezing Cardiovascular: negative for chest pain, fatigue and palpitations Gastrointestinal: negative for abdominal pain and change in bowel habits Genitourinary:negative abnormal vaginal discharge Integument/breast: negative for nipple discharge Musculoskeletal:negative for myalgias Neurological: negative for gait problems and tremors Behavioral/Psych: negative for abusive relationship; positive for emotional lability Endocrine: negative for temperature intolerance     Lab Review Urine pregnancy test Labs reviewed no Radiologic studies reviewed no  Objective:  BP 124/84  Pulse 68  Temp(Src) 99.3 F (37.4 C)  Ht 5' 6.5" (1.689 m)  Wt 92.08 kg (203 lb)  BMI 32.28 kg/m2  LMP 04/06/2014 General:   alert  Skin:   no rash or abnormalities  Lungs:   clear to auscultation bilaterally  Heart:   regular rate and rhythm, S1, S2 normal, no murmur, click, rub or gallop  Breasts:  normal without suspicious masses, skin or nipple changes or axillary nodes  Abdomen:  normal findings: no organomegaly, soft, non-tender and no hernia  Pelvis:  External genitalia: normal general appearance Urinary system: urethral meatus normal and bladder without fullness, nontender Vaginal: normal without tenderness, induration or masses Cervix: normal appearance Adnexa: normal bimanual exam Uterus: anteverted and non-tender, normal size  Limited pelvic U/S: normal ovaries/uterus  Assessment:    The patient has AUB, new onset  dysmenorrhea.  PALM-COEIN classification: likely  O, perimenopause   Plan:      Meds ordered this encounter  Medications  . ethynodiol-ethinyl estradiol (ZOVIA 1/35E, 28,) 1-35 MG-MCG tablet    Sig: Take 1 tablet by mouth daily.    Dispense:  1 Package    Refill:  11   Orders Placed This Encounter  Procedures  . GC/Chlamydia Probe Amp  . US Pelvis Complete    Standing Status: Future     Number of Occurrences:      Standing Expiration Date: 06/11/2015    Order Specific Question:  Reason for Exam (SYMPTOM  OR DIAGNOSIS REQUIRED)    Answer:  AUB    Order Specific Question:  Preferred imaging location?    Answer:  Internal  . TSH  . Comprehensive metabolic panel  . Prolactin  . CBC  . [place]  . Estradiol  . POCT urine pregnancy   Follow up after the U/S

## 2014-04-11 ENCOUNTER — Ambulatory Visit (INDEPENDENT_AMBULATORY_CARE_PROVIDER_SITE_OTHER): Payer: Federal, State, Local not specified - PPO

## 2014-04-11 DIAGNOSIS — D259 Leiomyoma of uterus, unspecified: Secondary | ICD-10-CM

## 2014-04-11 DIAGNOSIS — N939 Abnormal uterine and vaginal bleeding, unspecified: Secondary | ICD-10-CM

## 2014-04-11 DIAGNOSIS — N926 Irregular menstruation, unspecified: Secondary | ICD-10-CM

## 2014-04-11 DIAGNOSIS — N946 Dysmenorrhea, unspecified: Secondary | ICD-10-CM

## 2014-04-11 LAB — GC/CHLAMYDIA PROBE AMP
CT PROBE, AMP APTIMA: NEGATIVE
GC PROBE AMP APTIMA: NEGATIVE

## 2014-04-11 LAB — FOLLICLE STIMULATING HORMONE: FSH: 12.2 m[IU]/mL

## 2014-04-11 LAB — TSH: TSH: 1.184 u[IU]/mL (ref 0.350–4.500)

## 2014-04-11 LAB — ESTRADIOL: ESTRADIOL: 65.7 pg/mL

## 2014-04-11 LAB — PROLACTIN: PROLACTIN: 6.3 ng/mL

## 2014-04-12 LAB — POCT URINE PREGNANCY: PREG TEST UR: NEGATIVE

## 2014-04-26 ENCOUNTER — Encounter: Payer: Self-pay | Admitting: Obstetrics & Gynecology

## 2014-04-26 ENCOUNTER — Ambulatory Visit (INDEPENDENT_AMBULATORY_CARE_PROVIDER_SITE_OTHER): Payer: Federal, State, Local not specified - PPO | Admitting: Obstetrics & Gynecology

## 2014-04-26 VITALS — BP 118/81 | HR 52 | Temp 99.6°F | Ht 66.5 in | Wt 203.0 lb

## 2014-04-26 DIAGNOSIS — D259 Leiomyoma of uterus, unspecified: Secondary | ICD-10-CM

## 2014-04-26 DIAGNOSIS — N924 Excessive bleeding in the premenopausal period: Secondary | ICD-10-CM

## 2014-04-26 MED ORDER — NORETHIN ACE-ETH ESTRAD-FE 1-20 MG-MCG(24) PO TABS: 1.0000 | ORAL_TABLET | Freq: Every day | ORAL | Status: DC

## 2014-04-26 NOTE — Progress Notes (Signed)
Monique Hamilton is a 43 y.o.who presents for f/u of a work-up for irregular menses.   Patient Active Problem List   Diagnosis Date Noted  . Fitting and adjustment of gastric lap band 10/07/2013  . 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 Morbid obesity 06/16/2013  . OSA (obstructive sleep apnea) 01/18/2013  . Arthritis 11/18/2012   Past Medical History  Diagnosis Date  . Arthritis   . Morbid obesity   . Headache(784.0)     occasional migraines  . Hepatitis 1994    unsure of what type ( never showed up again)  . Nocturia   . Urgency of urination   . Sleep apnea     "mild" does not need c-pap  . Depression     has SAD - seasonal only no meds  . History of cervical dysplasia     Past Surgical History  Procedure Laterality Date  . Knee surgery  12/2011    left  . Wisdom tooth extraction  2004  . Breath tek h pylori N/A 12/29/2012    Procedure: BREATH TEK H PYLORI;  Surgeon: Pedro Earls, MD;  Location: [facility];  Service: General;  Laterality: N/A;  . Laparoscopic gastric banding N/A 06/28/2013    Procedure: LAPAROSCOPIC GASTRIC BANDING;  Surgeon: Pedro Earls, MD;  Location: [facility];  Service: General;  Laterality: N/A;  . Mesh applied to lap port N/A 06/28/2013    Procedure: MESH APPLIED TO LAP PORT;  Surgeon: Pedro Earls, MD;  Location: [facility];  Service: General;  Laterality: N/A;    Current outpatient prescriptions:ibuprofen (ADVIL,MOTRIN) 200 MG tablet, Take 600 mg by mouth every 6 (six) hours as needed for pain. , Disp: , Rfl: ;  Multiple Vitamin (MULTI VITAMIN DAILY PO), Take 1 tablet by mouth daily., Disp: , Rfl: ;  Norethindrone Acetate-Ethinyl Estrad-FE (LOESTRIN 24 FE) 1-20 MG-MCG(24) tablet, Take 1 tablet by mouth daily., Disp: 1 Package, Rfl: 11 Allergies  Allergen Reactions  . Lemon Flavor Other (See Comments)    headache    History  Substance Use Topics  . Smoking status: Never Smoker   . Smokeless tobacco: Never Used  . Alcohol Use: Yes   Comment: occasional    Family History  Problem Relation Age of Onset  . Breast cancer Mother   . Lung cancer Paternal Grandfather   . Cancer Maternal Grandmother   . Allergies Mother      Review of Systems Constitutional: negative for fatigue and positive for weight loss Respiratory: negative for cough and wheezing Cardiovascular: negative for chest pain, fatigue and palpitations Gastrointestinal: negative for abdominal pain and change in bowel habits Genitourinary:negative abnormal vaginal discharge Integument/breast: negative for nipple discharge Musculoskeletal:negative for myalgias Neurological: negative for gait problems and tremors Behavioral/Psych: negative for abusive relationship; positive for emotional lability Endocrine: negative for temperature intolerance     Lab Review  Labs reviewed yes Radiologic studies reviewed yes  Objective:  BP 118/81  Pulse 52  Temp(Src) 99.6 F (37.6 C)  Ht 5' 6.5" (1.689 m)  Wt 92.08 kg (203 lb)  BMI 32.28 kg/m2  LMP 04/06/2014   Assessment:    The patient has AUB, new onset dysmenorrhea.  PALM-COEIN classification: likely  O, perimenopause, L   Plan:      Meds ordered this encounter  Medications  . Norethindrone Acetate-Ethinyl Estrad-FE (LOESTRIN 24 FE) 1-20 MG-MCG(24) tablet    Sig: Take 1 tablet by mouth daily.    Dispense:  1 Package  Refill:  11  Keep menstrual diary/calendary Return in a few months

## 2014-05-11 ENCOUNTER — Encounter (INDEPENDENT_AMBULATORY_CARE_PROVIDER_SITE_OTHER): Payer: Self-pay

## 2014-05-11 ENCOUNTER — Ambulatory Visit (INDEPENDENT_AMBULATORY_CARE_PROVIDER_SITE_OTHER): Payer: Federal, State, Local not specified - PPO | Admitting: Physician Assistant

## 2014-05-11 VITALS — BP 128/74 | HR 72 | Temp 98.6°F | Resp 14 | Ht 66.0 in | Wt 203.8 lb

## 2014-05-11 DIAGNOSIS — Z4651 Encounter for fitting and adjustment of gastric lap band: Secondary | ICD-10-CM

## 2014-05-11 NOTE — Progress Notes (Signed)
  HISTORY: Monique Hamilton is a 43 y.o.female who received an AP-Standard lap-band in September 2014 by Dr. Hassell Done. The patient has gained 6 lbs since their last visit in June, and has lost 48 lbs since surgery. She did not have a fill at her last visit with Dr. Hassell Done. She complains of increased hunger and eating more than desired. She also is being treated for plantar fascitis which has restricted her physical activity. She is gradually increasing her exercise over time. She has no complaints of regurgitation or reflux.  VITAL SIGNS: Filed Vitals:   05/11/14 1044  BP: 128/74  Pulse: 72  Temp: 98.6 F (37 C)  Resp: 14    PHYSICAL EXAM: Physical exam reveals a very well-appearing 43 y.o.female in no apparent distress Neurologic: Awake, alert, oriented Psych: Bright affect, conversant Respiratory: Breathing even and unlabored. No stridor or wheezing Abdomen: Soft, nontender, nondistended to palpation. Incisions well-healed. No incisional hernias. Port easily palpated. Extremities: Atraumatic, good range of motion.  ASSESMENT: 43 y.o.  female  s/p AP-Standard lap-band.   PLAN: The patient's port was accessed with a 20G Huber needle without difficulty. Clear fluid was aspirated and 0.5 mL saline was added to the port. The patient was able to swallow water without difficulty following the procedure and was instructed to take clear liquids for the next 24-48 hours and advance slowly as tolerated. We'll have her back in one month or sooner if needed.

## 2014-05-11 NOTE — Patient Instructions (Signed)

## 2014-05-18 ENCOUNTER — Ambulatory Visit (INDEPENDENT_AMBULATORY_CARE_PROVIDER_SITE_OTHER): Payer: Federal, State, Local not specified - PPO | Admitting: General Surgery

## 2014-05-18 ENCOUNTER — Encounter (INDEPENDENT_AMBULATORY_CARE_PROVIDER_SITE_OTHER): Payer: Self-pay | Admitting: General Surgery

## 2014-05-18 VITALS — BP 126/74 | HR 73 | Resp 16 | Ht 66.0 in | Wt 192.0 lb

## 2014-05-18 DIAGNOSIS — Z4651 Encounter for fitting and adjustment of gastric lap band: Secondary | ICD-10-CM

## 2014-05-18 DIAGNOSIS — Z9884 Bariatric surgery status: Secondary | ICD-10-CM

## 2014-05-18 NOTE — Progress Notes (Signed)
Subjective:     Patient ID: Monique Hamilton, female   DOB: [birth year], 43 y.o.   MRN: [id number]  HPI A 43 year old Caucasian female status post laparoscopic adjustable gastric band placement September 2014 by Dr. Hassell Done comes in for some recent issues with her lap band. She had an adjustment last week on the 23rd. Her weight at that time was 203.8 pounds. She initially did well. On Tuesday morning, she woke up and drank a few sips of coffee and had immediate regurgitation. She felt better later that evening. She had a little bit of difficulty with solids the rest of the day. She denies any nighttime cough or reflux. She states that she generally feels better in the evening and has more restriction in the morning. She was able to eat half of a scrambled egg today.    Review of Systems     Objective:   Physical Exam BP 126/74  Pulse 73  Resp 16  Ht 5\' 6"  (1.676 m)  Wt 192 lb (87.091 kg)  BMI 31.00 kg/m2 Alert, nad, nontoxic Abdomen soft, nontender, nondistended palpable right mid abdominal port    Assessment:     Status post laparoscopic adjustable gastric band placement Obesity Regurgitation     Plan:     It appears she is using proper eating techniques. I explained to the patient that sometimes female patients have more restriction in the morning due to body fluid shifts. Nonetheless I do think she needs a small amount of fluid removed.  After obtaining verbal consent, the abdominal wall was prepped with Chloraprep. The port was accessed with a Huber needle and 0.25 cc of saline was removed to give the patient an expected fill volume of  cc.  The patient was able to tolerate sips of water.  I advised the patient to stay on liquids for the rest of the day. I also explained to the patient that anytime she has an episode of regurgitation she needs to revert to liquids for the rest of the day. We rediscussed proper eating techniques and behaviors. Followup as previously  scheduled  Note: This dictation was prepared with Dragon/digital dictation along with [employer]. Any transcriptional errors that result from this process are unintentional.  Leighton Ruff. Redmond Pulling, MD, FACS General, Bariatric, & Minimally Invasive Surgery [facility], [place]

## 2014-05-18 NOTE — Patient Instructions (Signed)
1. Stay on liquids for the next 1-2 days as you adapt to your new fill volume.  Then resume your previous diet. 2. Decreasing your carbohydrate intake will hasten you weight loss.  Rely more on proteins for your meals.  Avoid condiments that contain sweets such as Honey Mustard and sugary salad dressings.   3. Stay in the "green zone".  If you are regurgitating with meals, having night time reflux, and find yourself eating soft comfort foods (mashed potatoes, potato chips)...realize that you are developing "maladaptive eating".  You will not lose weight this way and may regain weight.  The GREEN ZONE is eating smaller portions and not regurgitating.  Hence we may need to withdraw fluid from your band. 4. Build exercise into your daily routine.  Walking is the best way to start but do something every day if you can.    Eating techniques 20-20-20 (30-30-30) 20 chews, 20 seconds between bites of food, 20 minutes to eat; sometimes you may need 30 chews, 30 seconds etc Use your nondominant hand to eat with Put fork down between bites of food Use a timer after swallowing to reinforce waiting 20-30 sec between bites of food Use a child/infant size utensil Try not to eat while watching TV

## 2014-06-08 ENCOUNTER — Encounter (INDEPENDENT_AMBULATORY_CARE_PROVIDER_SITE_OTHER): Payer: Federal, State, Local not specified - PPO

## 2014-06-29 ENCOUNTER — Encounter (INDEPENDENT_AMBULATORY_CARE_PROVIDER_SITE_OTHER): Payer: Federal, State, Local not specified - PPO

## 2014-07-27 ENCOUNTER — Ambulatory Visit (INDEPENDENT_AMBULATORY_CARE_PROVIDER_SITE_OTHER): Payer: Federal, State, Local not specified - PPO | Admitting: Obstetrics & Gynecology

## 2014-07-27 ENCOUNTER — Encounter: Payer: Self-pay | Admitting: Obstetrics & Gynecology

## 2014-07-27 VITALS — BP 134/87 | HR 57 | Temp 99.5°F | Ht 66.0 in | Wt 197.0 lb

## 2014-07-27 DIAGNOSIS — D251 Intramural leiomyoma of uterus: Secondary | ICD-10-CM

## 2014-07-27 NOTE — Progress Notes (Signed)
Patient ID: Monique Hamilton, female   DOB: Nov 26, 1970, 43 y.o.   MRN: [id number]  Chief Complaint  Patient presents with  . Follow-up    HPI Monique Hamilton is a 43 y.o. female.  Reports lighter menses/less cramping on COCP.  HPI  Past Medical History  Diagnosis Date  . Arthritis   . Morbid obesity   . Headache(784.0)     occasional migraines  . Hepatitis 1994    unsure of what type ( never showed up again)  . Nocturia   . Urgency of urination   . Sleep apnea     "mild" does not need c-pap  . Depression     has SAD - seasonal only no meds  . History of cervical dysplasia     Past Surgical History  Procedure Laterality Date  . Knee surgery  12/2011    left  . Wisdom tooth extraction  2004  . Breath tek h pylori N/A 12/29/2012    Procedure: BREATH TEK H PYLORI;  Surgeon: Pedro Earls, MD;  Location: [facility];  Service: General;  Laterality: N/A;  . Laparoscopic gastric banding N/A 06/28/2013    Procedure: LAPAROSCOPIC GASTRIC BANDING;  Surgeon: Pedro Earls, MD;  Location: [facility];  Service: General;  Laterality: N/A;  . Mesh applied to lap port N/A 06/28/2013    Procedure: MESH APPLIED TO LAP PORT;  Surgeon: Pedro Earls, MD;  Location: [facility];  Service: General;  Laterality: N/A;    Family History  Problem Relation Age of Onset  . Breast cancer Mother   . Lung cancer Paternal Grandfather   . Cancer Maternal Grandmother   . Allergies Mother     Social History History  Substance Use Topics  . Smoking status: Never Smoker   . Smokeless tobacco: Never Used  . Alcohol Use: Yes     Comment: occasional    Allergies  Allergen Reactions  . Lemon Flavor Other (See Comments)    headache    Current Outpatient Prescriptions  Medication Sig Dispense Refill  . ibuprofen (ADVIL,MOTRIN) 200 MG tablet Take 600 mg by mouth every 6 (six) hours as needed for pain.       . Multiple Vitamin (MULTI VITAMIN DAILY PO) Take 1 tablet by mouth daily.      .  Norethindrone Acetate-Ethinyl Estrad-FE (LOESTRIN 24 FE) 1-20 MG-MCG(24) tablet Take 1 tablet by mouth daily.  1 Package  11   No current facility-administered medications for this visit.    Review of Systems Review of Systems Constitutional: negative for fatigue and weight loss Respiratory: negative for cough and wheezing Cardiovascular: negative for chest pain, fatigue and palpitations Gastrointestinal: negative for abdominal pain and change in bowel habits Genitourinary:negative for abnormal uterine bleeding, painful menses Integument/breast: negative for nipple discharge Musculoskeletal:negative for myalgias Neurological: negative for gait problems and tremors Behavioral/Psych: negative for abusive relationship, depression Endocrine: negative for temperature intolerance     Blood pressure 134/87, pulse 57, temperature 99.5 F (37.5 C), height 5\' 6"  (1.676 m), weight 89.359 kg (197 lb).  Physical Exam Physical Exam   50% of 15 min visit spent on counseling and coordination of care.   Data Reviewed None  Assessment    AUB with a good response to the COCP    Plan    Possible management options include: continue COCP for now Follow up as needed.         JACKSON-MOORE,Tayshawn Purnell A 07/27/2014, 3:54 PM

## 2014-07-31 ENCOUNTER — Telehealth: Payer: Self-pay | Admitting: *Deleted

## 2014-07-31 NOTE — Telephone Encounter (Signed)
Patient had an appointment on Thursday and her cycle started Friday- mid pack. She was told to call back if that happened for a possible change in her OCP. Patient uses the pill for cycle regulation.

## 2014-08-02 ENCOUNTER — Telehealth: Payer: Self-pay | Admitting: *Deleted

## 2014-08-02 NOTE — Telephone Encounter (Signed)
Patient states she called on Monday regarding her OCP management. She has had BTB and stopped her pills as instructed at her last visit. She is waiting to hear from her doctor as to her alternative treatment. 3:40 Call to patient - let her know the doctor has been notified regarding her call and we are awaiting her response.

## 2014-08-05 NOTE — Telephone Encounter (Signed)
Change to Nordette (30 mcg)

## 2014-08-09 ENCOUNTER — Other Ambulatory Visit: Payer: Self-pay | Admitting: *Deleted

## 2014-08-09 DIAGNOSIS — Z3041 Encounter for surveillance of contraceptive pills: Secondary | ICD-10-CM

## 2014-08-09 MED ORDER — LEVONORGESTREL-ETHINYL ESTRAD 0.15-30 MG-MCG PO TABS: 1.0000 | ORAL_TABLET | Freq: Every day | ORAL | Status: DC

## 2014-08-09 NOTE — Telephone Encounter (Signed)
Rx sent to pharmacy- patient notified per VM

## 2014-08-21 ENCOUNTER — Encounter: Payer: Self-pay | Admitting: Obstetrics & Gynecology

## 2014-09-05 IMAGING — CR DG CHEST 2V
2 series · 2 of 2 positions shown · non-contrast
Comparison: None

CLINICAL DATA: Obesity

CHEST - 2 VIEW

[view not recorded (1 of 2)]
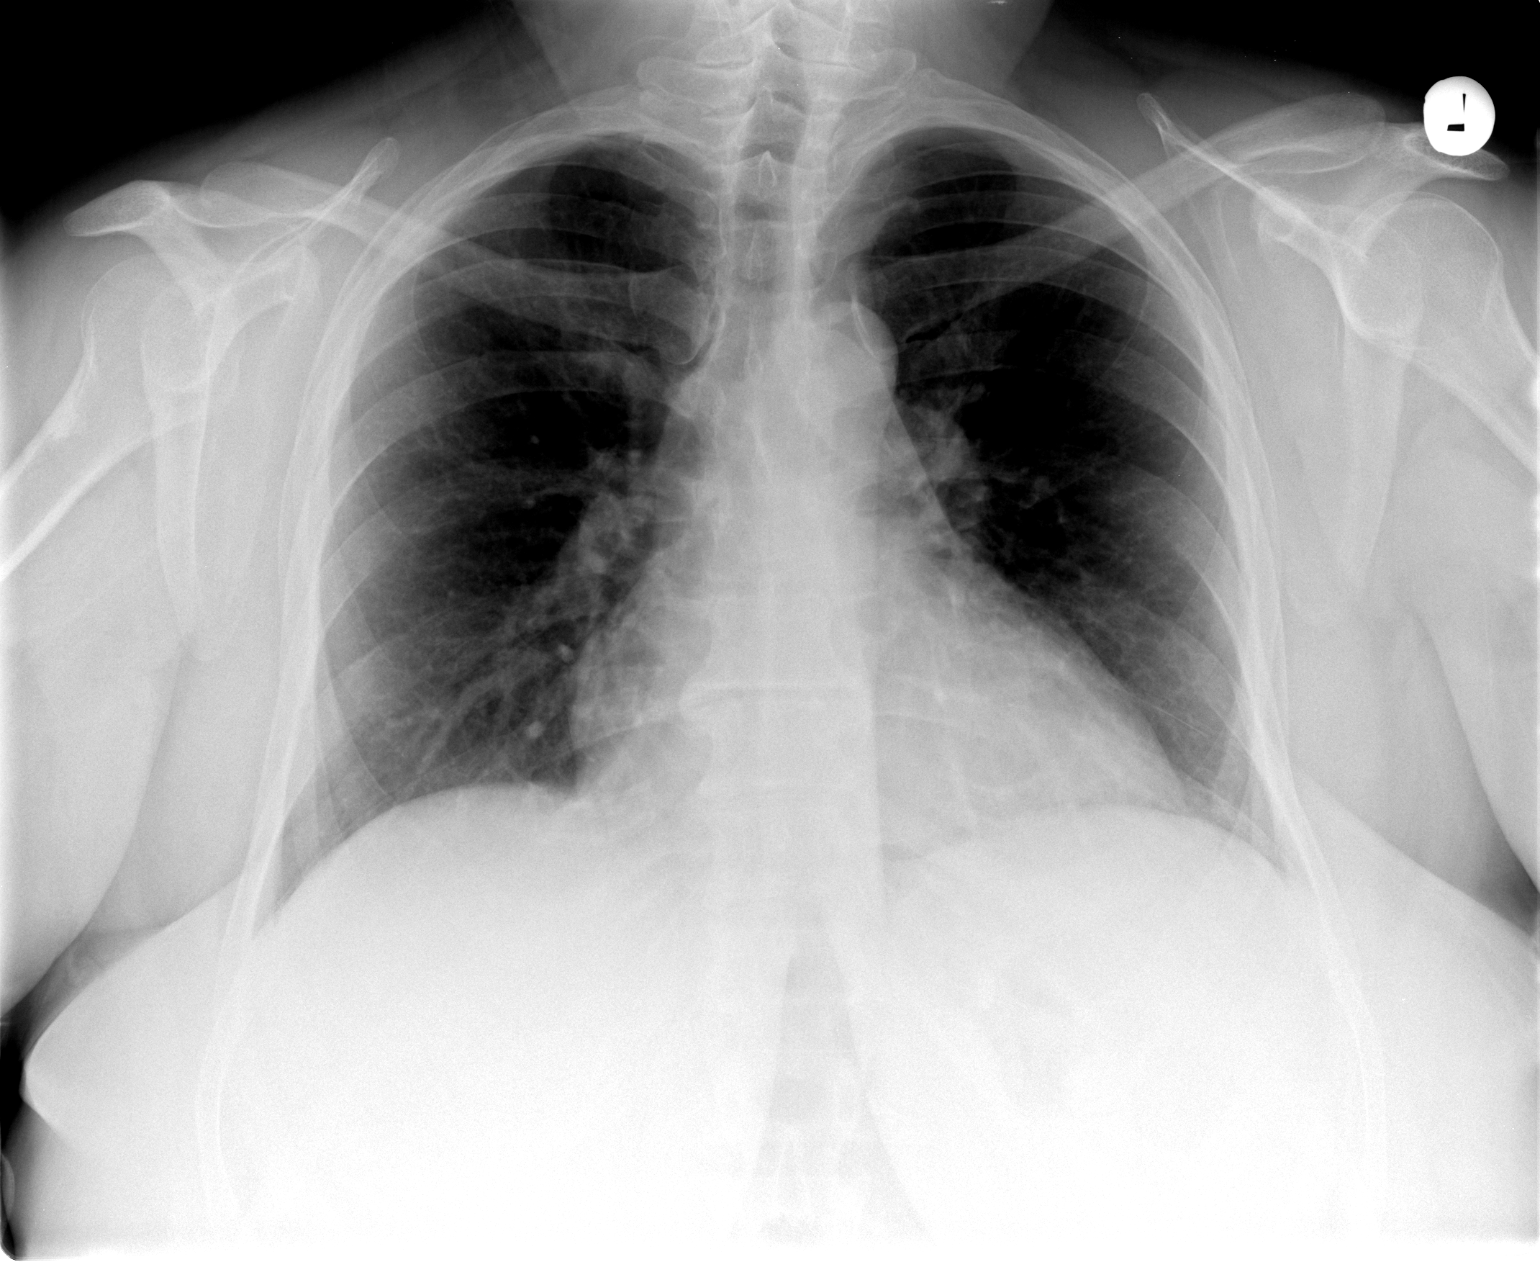

[view not recorded (2 of 2)]
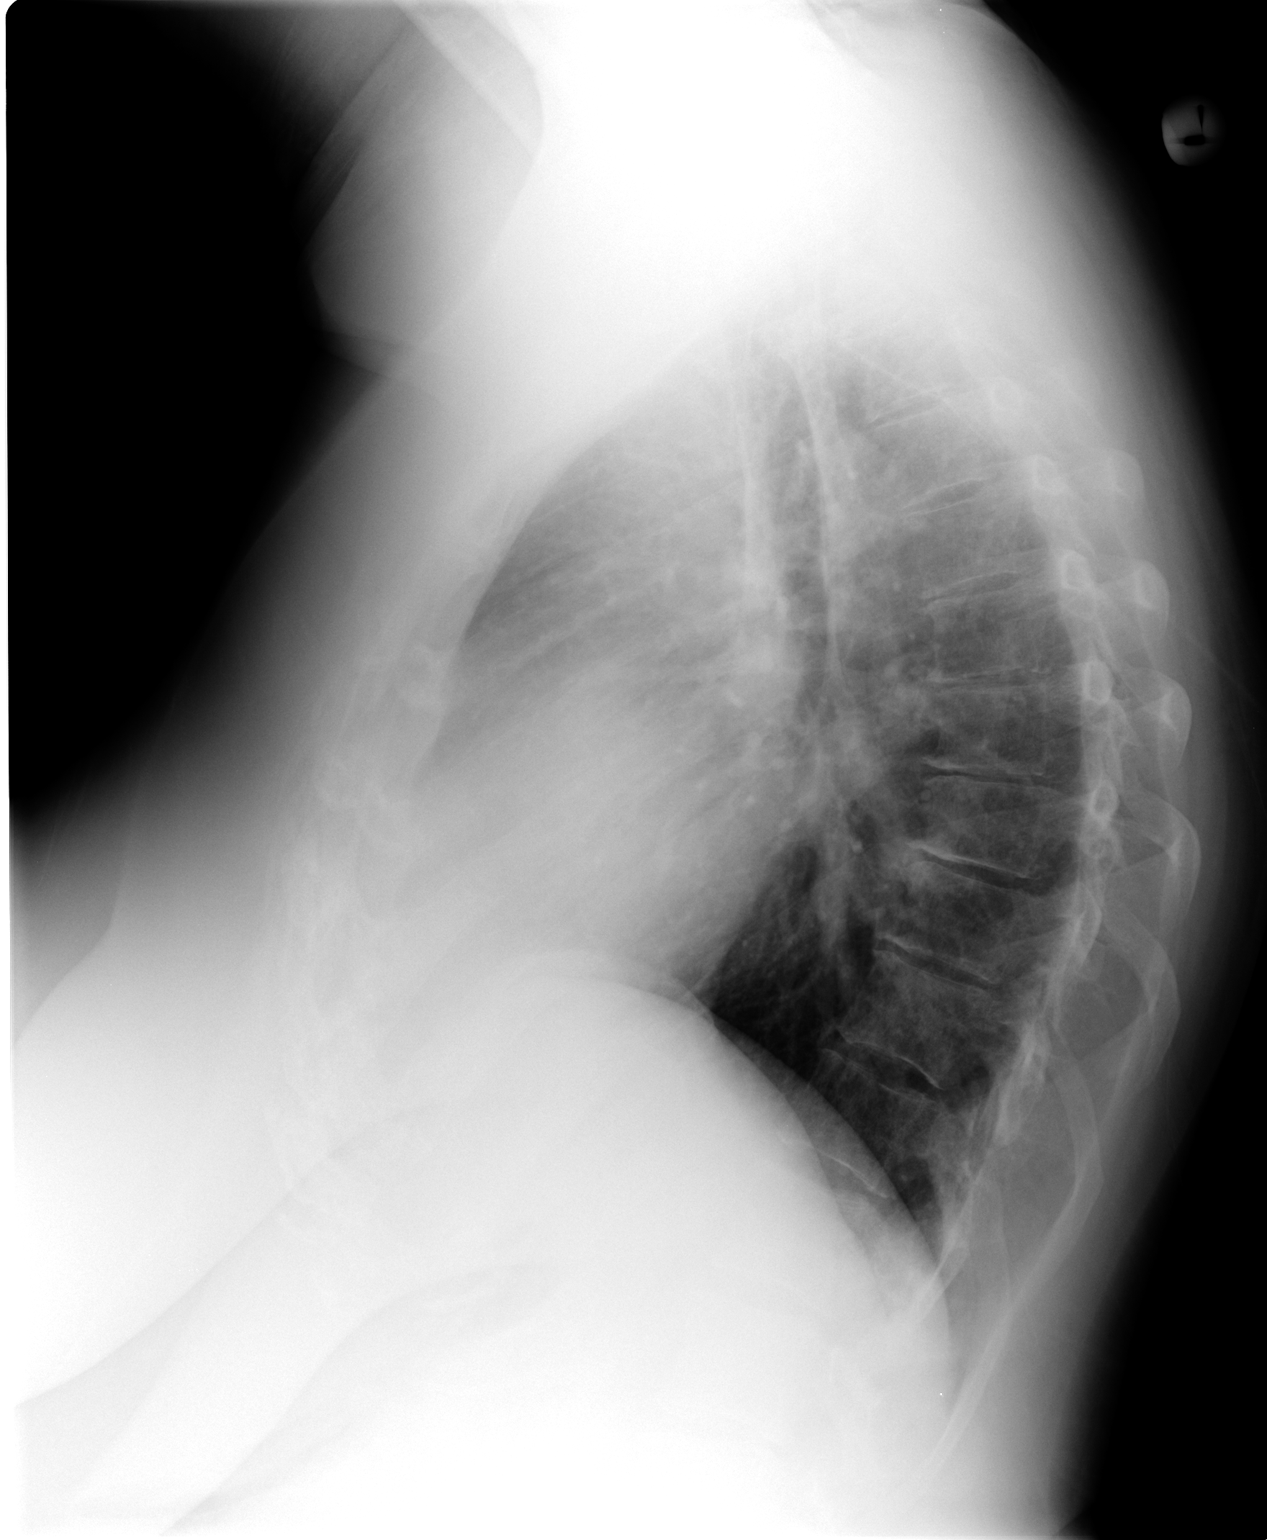

[2 of 2 positions shown; findings below may reference images not displayed]

FINDINGS: Borderline cardiomegaly.  No acute infiltrate or pleural
effusion.  No pulmonary edema.  Mild degenerative changes thoracic
spine
IMPRESSION: No active disease.  Borderline cardiomegaly.  Mild degenerative
changes thoracic spine.

## 2014-10-16 ENCOUNTER — Encounter: Payer: Self-pay | Admitting: *Deleted

## 2014-10-17 ENCOUNTER — Encounter: Payer: Self-pay | Admitting: Obstetrics & Gynecology

## 2014-10-24 ENCOUNTER — Other Ambulatory Visit: Payer: Self-pay | Admitting: Obstetrics & Gynecology

## 2014-10-24 DIAGNOSIS — Z1231 Encounter for screening mammogram for malignant neoplasm of breast: Secondary | ICD-10-CM

## 2014-11-13 ENCOUNTER — Ambulatory Visit (HOSPITAL_COMMUNITY)
Admission: RE | Admit: 2014-11-13 | Discharge: 2014-11-13 | Disposition: A | Discharge status: Home / Self Care | Payer: Federal, State, Local not specified - PPO | Source: Ambulatory Visit | Attending: Obstetrics & Gynecology | Admitting: Obstetrics & Gynecology

## 2014-11-13 DIAGNOSIS — Z1231 Encounter for screening mammogram for malignant neoplasm of breast: Secondary | ICD-10-CM

## 2015-02-11 ENCOUNTER — Emergency Department (HOSPITAL_COMMUNITY)
Admission: EM | Admit: 2015-02-11 | Discharge: 2015-02-11 | Disposition: A | Discharge status: Home / Self Care | Payer: Federal, State, Local not specified - PPO | Attending: Emergency Medicine | Admitting: Emergency Medicine

## 2015-02-11 ENCOUNTER — Emergency Department (HOSPITAL_COMMUNITY): Payer: Federal, State, Local not specified - PPO

## 2015-02-11 ENCOUNTER — Encounter (HOSPITAL_COMMUNITY): Payer: Self-pay | Admitting: Emergency Medicine

## 2015-02-11 DIAGNOSIS — Z8669 Personal history of other diseases of the nervous system and sense organs: Secondary | ICD-10-CM | POA: Insufficient documentation

## 2015-02-11 DIAGNOSIS — R630 Anorexia: Secondary | ICD-10-CM | POA: Insufficient documentation

## 2015-02-11 DIAGNOSIS — R112 Nausea with vomiting, unspecified: Secondary | ICD-10-CM | POA: Insufficient documentation

## 2015-02-11 DIAGNOSIS — M1990 Unspecified osteoarthritis, unspecified site: Secondary | ICD-10-CM | POA: Insufficient documentation

## 2015-02-11 DIAGNOSIS — Z793 Long term (current) use of hormonal contraceptives: Secondary | ICD-10-CM | POA: Diagnosis not present

## 2015-02-11 DIAGNOSIS — Z79899 Other long term (current) drug therapy: Secondary | ICD-10-CM | POA: Diagnosis not present

## 2015-02-11 DIAGNOSIS — Z9884 Bariatric surgery status: Secondary | ICD-10-CM | POA: Insufficient documentation

## 2015-02-11 DIAGNOSIS — Z87410 Personal history of cervical dysplasia: Secondary | ICD-10-CM | POA: Insufficient documentation

## 2015-02-11 DIAGNOSIS — Z8719 Personal history of other diseases of the digestive system: Secondary | ICD-10-CM | POA: Diagnosis not present

## 2015-02-11 DIAGNOSIS — Z4651 Encounter for fitting and adjustment of gastric lap band: Secondary | ICD-10-CM | POA: Insufficient documentation

## 2015-02-11 DIAGNOSIS — Z8659 Personal history of other mental and behavioral disorders: Secondary | ICD-10-CM | POA: Insufficient documentation

## 2015-02-11 NOTE — ED Notes (Signed)
Per dr Lucia Gaskins, as long as patient is tolerating PO fluids, she is able to leave once xray is completed.

## 2015-02-11 NOTE — ED Notes (Signed)
Pt tolerating PO fluids

## 2015-02-11 NOTE — Consult Note (Signed)
Re:   Monique Hamilton DOB:   04-Feb-1971 MRN:   [id number]  ASSESSMENT AND PLAN: 1.  Lap band - 99/2014 - M. Hassell Done  She has successfully lost about 60 pounds.  2.  Nausea and vomiting  Lap band is too tight  I removed all the fluid - I withdrew 5 cc  Will get KUB while in the ER.  Chief Complaint  Patient presents with  . Nausea  . Emesis   REFERRING PHYSICIAN: Tawanna Solo, MD  HISTORY OF PRESENT ILLNESS: Monique Hamilton is a 44 y.o. (DOB: [birth year])  white  female whose primary care physician is Monique Solo, MD and [facility] for nausea and vomiting.  Monique Hamilton has done well from her lap band - but recently has had trouble finding the right amount.  Monique Hamilton has had fluid removed from her lap - then added it back over the last month. She thinks that she has 3.5 cc in her lap band.  Since this is her second episode of the lap band being too tight in a short time, I will get a KUB of her lap band while she is here.    Past Medical History  Diagnosis Date  . Arthritis   . Morbid obesity   . Headache(784.0)     occasional migraines  . Hepatitis 1994    unsure of what type ( never showed up again)  . Nocturia   . Urgency of urination   . Sleep apnea     "mild" does not need c-pap  . Depression     has SAD - seasonal only no meds  . History of cervical dysplasia       Past Surgical History  Procedure Laterality Date  . Knee surgery  12/2011    left  . Wisdom tooth extraction  2004  . Breath tek h pylori N/A 12/29/2012    Procedure: BREATH TEK H PYLORI;  Surgeon: Pedro Earls, MD;  Location: [facility];  Service: General;  Laterality: N/A;  . Laparoscopic gastric banding N/A 06/28/2013    Procedure: LAPAROSCOPIC GASTRIC BANDING;  Surgeon: Pedro Earls, MD;  Location: [facility];  Service: General;  Laterality: N/A;  . Mesh applied to lap port N/A 06/28/2013    Procedure: MESH APPLIED TO LAP PORT;  Surgeon: Pedro Earls, MD;   Location: [facility];  Service: General;  Laterality: N/A;      No current facility-administered medications for this encounter.   Current Outpatient Prescriptions  Medication Sig Dispense Refill  . ibuprofen (ADVIL,MOTRIN) 200 MG tablet Take 600 mg by mouth every 6 (six) hours as needed for pain.     Marland Kitchen levonorgestrel-ethinyl estradiol (NORDETTE, 28,) 0.15-30 MG-MCG tablet Take 1 tablet by mouth daily. 1 Package 11  . Multiple Vitamin (MULTI VITAMIN DAILY PO) Take 1 tablet by mouth daily.        Allergies  Allergen Reactions  . Lemon Flavor Other (See Comments)    headache     SOCIAL and FAMILY HISTORY: Comes by self to ER.  PHYSICAL EXAM: BP 159/96 mmHg  Pulse 95  Temp(Src) 98.6 F (37 C) (Oral)  Resp 17  SpO2 98%  General: WN WF who is alert and generally healthy appearing.  HEENT: Normal. Pupils equal.  Lungs: Clear to auscultation and symmetric breath sounds. Heart:  RRR. No murmur or rub. Abdomen: Soft. Lap band port in RUQ.    PROCEDURE:  I accessed her lap band  and removed 5.0 cc of fluid.  She took water and tolerated this.  Will get a KUB while she is here.    DATA REVIEWED: Epic Notes  Alphonsa Overall, MD,  [facility], [address]  [place], [place]    [place] Phone:  [phone] FAX:  [phone]

## 2015-02-11 NOTE — Discharge Instructions (Signed)
If you were given medicines take as directed.  If you are on coumadin or contraceptives realize their levels and effectiveness is altered by many different medicines.  If you have any reaction (rash, tongues swelling, other) to the medicines stop taking and see a physician.   Please follow up as directed and return to the ER or see a physician for new or worsening symptoms.  Thank you. Filed Vitals:   02/11/15 0842  BP: 159/96  Pulse: 95  Temp: 98.6 F (37 C)  TempSrc: Oral  Resp: 17  SpO2: 98%

## 2015-02-11 NOTE — ED Notes (Addendum)
Pt sent here due to issues with her lap band that was placed over year ago.  Pt started vomiting/spitting up yesterday and all night.  Pt states that about two months ago had similar problems and had to have band adjusted.  Pt c/o sore throat due to vomiting. Dr Lucia Gaskins pt.

## 2015-02-11 NOTE — ED Provider Notes (Signed)
CSN: [id number]     Arrival date & time 02/11/15  6568 History   First MD Initiated Contact with Patient 02/11/15 [phone]     Chief Complaint  Patient presents with  . Nausea  . Emesis     (Consider location/radiation/quality/duration/timing/severity/associated sxs/prior Treatment) HPI Comments: 44 year old female with history of sleep apnea, morbid obesity, lap band September 2014 with multiple adjustments since presents to the ED for recurrent vomiting since yesterday evening. Mild bile component no bleeding. No focal abdominal pain fevers or chills. Patient unable to tolerate even water. In the past this has improved with adjustment lap band.  Nonsmoker  Patient is a 44 y.o. female presenting with vomiting. The history is provided by the patient.  Emesis Associated symptoms: no abdominal pain, no chills and no headaches     Past Medical History  Diagnosis Date  . Arthritis   . Morbid obesity   . Headache(784.0)     occasional migraines  . Hepatitis 1994    unsure of what type ( never showed up again)  . Nocturia   . Urgency of urination   . Sleep apnea     "mild" does not need c-pap  . Depression     has SAD - seasonal only no meds  . History of cervical dysplasia    Past Surgical History  Procedure Laterality Date  . Knee surgery  12/2011    left  . Wisdom tooth extraction  2004  . Breath tek h pylori N/A 12/29/2012    Procedure: BREATH TEK H PYLORI;  Surgeon: Pedro Earls, MD;  Location: [facility];  Service: General;  Laterality: N/A;  . Laparoscopic gastric banding N/A 06/28/2013    Procedure: LAPAROSCOPIC GASTRIC BANDING;  Surgeon: Pedro Earls, MD;  Location: [facility];  Service: General;  Laterality: N/A;  . Mesh applied to lap port N/A 06/28/2013    Procedure: MESH APPLIED TO LAP PORT;  Surgeon: Pedro Earls, MD;  Location: [facility];  Service: General;  Laterality: N/A;   Family History  Problem Relation Age of Onset  . Breast cancer Mother   . Lung cancer  Paternal Grandfather   . Cancer Maternal Grandmother   . Allergies Mother    History  Substance Use Topics  . Smoking status: Never Smoker   . Smokeless tobacco: Never Used  . Alcohol Use: Yes     Comment: occasional   OB History    Gravida Para Term Preterm AB TAB SAB Ectopic Multiple Living   5 4 4  1  1   4      Review of Systems  Constitutional: Positive for appetite change. Negative for fever and chills.  HENT: Negative for congestion.   Eyes: Negative for visual disturbance.  Respiratory: Negative for shortness of breath.   Cardiovascular: Negative for chest pain.  Gastrointestinal: Positive for nausea and vomiting. Negative for abdominal pain.  Genitourinary: Negative for dysuria and flank pain.  Musculoskeletal: Negative for back pain, neck pain and neck stiffness.  Skin: Negative for rash.  Neurological: Negative for light-headedness and headaches.      Allergies  Lemon flavor  Home Medications   Prior to Admission medications   Medication Sig Start Date End Date Taking? Authorizing Provider  ibuprofen (ADVIL,MOTRIN) 200 MG tablet Take 600 mg by mouth every 6 (six) hours as needed for pain.    Yes Historical Provider, MD  Multiple Vitamin (MULTI VITAMIN DAILY PO) Take 1 tablet by mouth daily.   Yes Historical Provider,  MD  levonorgestrel-ethinyl estradiol (NORDETTE, 28,) 0.15-30 MG-MCG tablet Take 1 tablet by mouth daily. Patient not taking: Reported on 02/11/2015 08/09/14   Lahoma Crocker, MD   BP 159/96 mmHg  Pulse 95  Temp(Src) 98.6 F (37 C) (Oral)  Resp 17  SpO2 98%  LMP 01/28/2015 (Approximate) Physical Exam  Constitutional: She is oriented to person, place, and time. She appears well-developed and well-nourished.  HENT:  Head: Normocephalic and atraumatic.  Mild dry mucous membranes  Eyes: Conjunctivae are normal. Right eye exhibits no discharge. Left eye exhibits no discharge.  Neck: Normal range of motion. Neck supple. No tracheal deviation  present.  Cardiovascular: Normal rate and regular rhythm.   Pulmonary/Chest: Effort normal and breath sounds normal.  Abdominal: Soft. She exhibits no distension. There is no tenderness. There is no guarding.  Musculoskeletal: She exhibits no edema.  Neurological: She is alert and oriented to person, place, and time.  Skin: Skin is warm. No rash noted.  Psychiatric: She has a normal mood and affect.  Nursing note and vitals reviewed.   ED Course  Procedures (including critical care time) Labs Review Labs Reviewed - No data to display  Imaging Review No results found.   EKG Interpretation None      MDM   Final diagnoses:  Fitting and adjustment of gastric lap band  Non-intractable vomiting with nausea, vomiting of unspecified type   Patient was followed closely by general surgery with LAP-BAND history presents with recurrent vomiting concern for slipping/related to lap band. No fevers, overall well-appearing in ER. Dr. Lucia Gaskins general surgeon coming in to assess the patient.  General surgery evaluated and adjusted lapband in ED, xray reviewed, no acute findings.   Results and differential diagnosis were discussed with the patient/parent/guardian. Close follow up outpatient was discussed, comfortable with the plan.   Medications - No data to display  Filed Vitals:   02/11/15 0842  BP: 159/96  Pulse: 95  Temp: 98.6 F (37 C)  TempSrc: Oral  Resp: 17  SpO2: 98%    Final diagnoses:  Fitting and adjustment of gastric lap band  Non-intractable vomiting with nausea, vomiting of unspecified type       Elnora Morrison, MD 02/11/15 [phone]

## 2015-03-30 ENCOUNTER — Ambulatory Visit (INDEPENDENT_AMBULATORY_CARE_PROVIDER_SITE_OTHER): Payer: Federal, State, Local not specified - PPO | Admitting: Internal Medicine

## 2015-03-30 VITALS — BP 120/84 | HR 91 | Temp 99.0°F | Resp 18 | Ht 67.25 in | Wt 197.2 lb

## 2015-03-30 DIAGNOSIS — IMO0001 Reserved for inherently not codable concepts without codable children: Secondary | ICD-10-CM

## 2015-03-30 DIAGNOSIS — M609 Myositis, unspecified: Secondary | ICD-10-CM

## 2015-03-30 DIAGNOSIS — R519 Headache, unspecified: Secondary | ICD-10-CM

## 2015-03-30 DIAGNOSIS — R6883 Chills (without fever): Secondary | ICD-10-CM | POA: Diagnosis not present

## 2015-03-30 DIAGNOSIS — N766 Ulceration of vulva: Secondary | ICD-10-CM | POA: Diagnosis not present

## 2015-03-30 DIAGNOSIS — R51 Headache: Secondary | ICD-10-CM | POA: Diagnosis not present

## 2015-03-30 DIAGNOSIS — M791 Myalgia: Secondary | ICD-10-CM | POA: Diagnosis not present

## 2015-03-30 LAB — POCT CBC
GRANULOCYTE PERCENT: 69.5 % (ref 37–80)
HCT, POC: 44.6 % (ref 37.7–47.9)
Hemoglobin: 14.4 g/dL (ref 12.2–16.2)
Lymph, poc: 2 (ref 0.6–3.4)
MCH, POC: 29.6 pg (ref 27–31.2)
MCHC: 32.3 g/dL (ref 31.8–35.4)
MCV: 91.6 fL (ref 80–97)
MID (cbc): 0.5 (ref 0–0.9)
MPV: 7.2 fL (ref 0–99.8)
PLATELET COUNT, POC: 248 10*3/uL (ref 142–424)
POC Granulocyte: 5.8 (ref 2–6.9)
POC LYMPH %: 24.5 % (ref 10–50)
POC MID %: 6 %M (ref 0–12)
RBC: 4.87 M/uL (ref 4.04–5.48)
RDW, POC: 13.2 %
WBC: 8.3 10*3/uL (ref 4.6–10.2)

## 2015-03-30 LAB — POCT URINALYSIS DIPSTICK
Glucose, UA: NEGATIVE
Ketones, UA: 40
Leukocytes, UA: NEGATIVE
Nitrite, UA: NEGATIVE
PH UA: 5.5
UROBILINOGEN UA: 0.2

## 2015-03-30 LAB — POCT UA - MICROSCOPIC ONLY
CASTS, UR, LPF, POC: NEGATIVE
CRYSTALS, UR, HPF, POC: NEGATIVE
MUCUS UA: NEGATIVE
Yeast, UA: POSITIVE

## 2015-03-30 LAB — HIV ANTIBODY (ROUTINE TESTING W REFLEX): HIV 1&2 Ab, 4th Generation: NONREACTIVE

## 2015-03-30 LAB — RPR

## 2015-03-30 MED ORDER — VALACYCLOVIR HCL 1 G PO TABS: 1000.0000 mg | ORAL_TABLET | Freq: Three times a day (TID) | ORAL | Status: DC

## 2015-03-30 NOTE — Progress Notes (Signed)
Subjective:  This chart was scribed for Tami Lin MD, by Tamsen Roers, at [facility].  This patient was seen in room 12 and the patient's care was started at 10:22 AM.    Patient ID: Monique Hamilton, female    DOB: [birth year], 44 y.o.   MRN: [id number] Chief Complaint  Patient presents with  . Generalized Body Aches    x3 days  . Headache    x1 day  . Chills    x1 days  . Nausea    Started this morning    HPI  HPI Comments: Monique Hamilton is a 44 y.o. female who presents to the [facility] complaining of multiple symptoms including generalized body aches (lower body, onset 3 days ago), constant frontal headache (1 day ago), fever/chills (1 day ago and nausea onset this morning.  She does not have migraines regularly.  She has associated symptoms of a sore throat, dysuria and generalized lower back pain.  Denies diarrhea, skin rash or any recent changes in her bowel movements.  She has not had anything to eat today and had been able to drink water.   Sore: She also had a sorefew days on her labia which is tender to touch.  She feels pain bilaterally in her groin region. Married. One partner only. Never HSV.   Patient Active Problem List   Diagnosis Date Noted  . 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 Fitting and adjustment of gastric lap band 10/07/2013  . 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 Morbid obesity 06/16/2013  . OSA (obstructive sleep apnea) 01/18/2013  . Arthritis 11/18/2012   Past Medical History  Diagnosis Date  . Arthritis   . Morbid obesity   . Headache(784.0)     occasional migraines  . Hepatitis 1994    unsure of what type ( never showed up again)  . Nocturia   . Urgency of urination   . Sleep apnea     "mild" does not need c-pap  . Depression     has SAD - seasonal only no meds  . History of cervical dysplasia    Past Surgical History  Procedure Laterality Date  . Knee surgery  12/2011   left  . Wisdom tooth extraction  2004  . Breath tek h pylori N/A 12/29/2012    Procedure: BREATH TEK H PYLORI;  Surgeon: Pedro Earls, MD;  Location: [facility];  Service: General;  Laterality: N/A;  . Laparoscopic gastric banding N/A 06/28/2013    Procedure: LAPAROSCOPIC GASTRIC BANDING;  Surgeon: Pedro Earls, MD;  Location: [facility];  Service: General;  Laterality: N/A;  . Mesh applied to lap port N/A 06/28/2013    Procedure: MESH APPLIED TO LAP PORT;  Surgeon: Pedro Earls, MD;  Location: [facility];  Service: General;  Laterality: N/A;   Allergies  Allergen Reactions  . Lemon Flavor Other (See Comments)    headache   Prior to Admission medications   Medication Sig Start Date End Date Taking? Authorizing Provider  ibuprofen (ADVIL,MOTRIN) 200 MG tablet Take 600 mg by mouth every 6 (six) hours as needed for pain.    Yes Historical Provider, MD  levonorgestrel-ethinyl estradiol (NORDETTE, 28,) 0.15-30 MG-MCG tablet Take 1 tablet by mouth daily. Patient not taking: Reported on 02/11/2015 08/09/14   Lahoma Crocker, MD  Multiple Vitamin (MULTI VITAMIN DAILY PO) Take 1 tablet by mouth daily.    Historical Provider, MD   History  Social History  . Marital Status: Married    Spouse Name: N/A  . Number of Children: N/A  . Years of Education: N/A   Occupational History  . self employed    Social History Main Topics  . Smoking status: Never Smoker   . Smokeless tobacco: Never Used  . Alcohol Use: Yes     Comment: occasional  . Drug Use: No  . Sexual Activity:    Partners: Male     Comment: partner has vasectomy   Other Topics Concern  . Not on file   Social History Narrative     Allergies  Allergen Reactions  . Lemon Flavor Other (See Comments)    headache       Review of Systems  Constitutional: Positive for fever and chills.  HENT: Positive for sore throat.   Gastrointestinal: Positive for nausea. Negative for vomiting, diarrhea, constipation and blood  in stool.  Genitourinary: Positive for dysuria and genital sores. Negative for urgency, frequency, decreased urine volume, vaginal bleeding, vaginal discharge and difficulty urinating.  Musculoskeletal: Positive for back pain.  Skin: Negative for rash.  Neurological: Positive for headaches.       Objective:   Physical Exam  Constitutional: She is oriented to person, place, and time. She appears well-developed and well-nourished. No distress.  HENT:  Head: Normocephalic and atraumatic.  Right Ear: External ear normal.  Left Ear: External ear normal.  Nose: Nose normal.  Mouth/Throat: Oropharynx is clear and moist.  Eyes: Conjunctivae and EOM are normal. Pupils are equal, round, and reactive to light.  Neck: Normal range of motion. No thyromegaly present.  Cardiovascular: Normal rate, regular rhythm and normal heart sounds.   No murmur heard. Pulmonary/Chest: Effort normal and breath sounds normal. No respiratory distress.  Abdominal: Soft. She exhibits no mass. There is no tenderness.  Genitourinary:  She has cluster of vesiciles with 2 open ulcers left labia.  Tender!  Musculoskeletal: Normal range of motion. She exhibits no edema.  cva tender to percussion on the left  Tender in posterior thighs bilaterally without mass or cord, straight leg raise negative bilaterally   Lymphadenopathy:    She has no cervical adenopathy.  Neurological: She is alert and oriented to person, place, and time.  Skin: Skin is warm and dry.  Psychiatric: She has a normal mood and affect. Her behavior is normal.  Nursing note and vitals reviewed.  Filed Vitals:   03/30/15 0956  BP: 120/84  Pulse: 91  Temp: 99 F (37.2 C)  TempSrc: Oral  Resp: 18  Height: 5' 7.25" (1.708 m)  Weight: 197 lb 3.2 oz (89.449 kg)  SpO2: 99%   Results for orders placed or performed in visit on 03/30/15  POCT urinalysis dipstick  Result Value Ref Range   Color, UA yellow    Clarity, UA clear    Glucose, UA neg      Bilirubin, UA small    Ketones, UA 40    Spec Grav, UA >=1.030    Blood, UA small    pH, UA 5.5    Protein, UA trace    Urobilinogen, UA 0.2    Nitrite, UA neg    Leukocytes, UA Negative   POCT CBC  Result Value Ref Range   WBC 8.3 4.6 - 10.2 K/uL   Lymph, poc 2.0 0.6 - 3.4   POC LYMPH PERCENT 24.5 10 - 50 %L   MID (cbc) 0.5 0 - 0.9   POC MID % 6.0 0 -  12 %M   POC Granulocyte 5.8 2 - 6.9   Granulocyte percent 69.5 37 - 80 %G   RBC 4.87 4.04 - 5.48 M/uL   Hemoglobin 14.4 12.2 - 16.2 g/dL   HCT, POC 44.6 37.7 - 47.9 %   MCV 91.6 80 - 97 fL   MCH, POC 29.6 27 - 31.2 pg   MCHC 32.3 31.8 - 35.4 g/dL   RDW, POC 13.2 %   Platelet Count, POC 248 142 - 424 K/uL   MPV 7.2 0 - 99.8 fL  POCT UA - Microscopic Only  Result Value Ref Range   WBC, Ur, HPF, POC 1-6    RBC, urine, microscopic 1-3    Bacteria, U Microscopic trace    Mucus, UA neg    Epithelial cells, urine per micros 5-10    Crystals, Ur, HPF, POC neg    Casts, Ur, LPF, POC neg    Yeast, UA positive           Assessment & Plan:  I have completed the patient encounter in its entirety as documented by the scribe, with editing by me where necessary. Shundra Wirsing P. Laney Pastor, M.D.  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 We'll screen for other communicable diseases  Meds ordered this encounter  Medications  . valACYclovir (VALTREX) 1000 MG tablet    Sig: Take 1 tablet (1,000 mg total) by mouth 3 (three) times daily.    Dispense:  21 tablet    Refill:  0

## 2015-03-30 NOTE — Patient Instructions (Signed)
Genital Herpes °Genital herpes is a sexually transmitted disease. This means that it is a disease passed by having sex with an infected person. There is no cure for genital herpes. The time between attacks can be months to years. The virus may live in a person but produce no problems (symptoms). This infection can be passed to a baby as it travels down the birth canal (vagina). In a newborn, this can cause central nervous system damage, eye damage, or even death. The virus that causes genital herpes is usually HSV-2 virus. The virus that causes oral herpes is usually HSV-1. The diagnosis (learning what is wrong) is made through culture results. °SYMPTOMS  °Usually symptoms of pain and itching begin a few days to a week after contact. It first appears as small blisters that progress to small painful ulcers which then scab over and heal after several days. It affects the outer genitalia, birth canal, cervix, penis, anal area, buttocks, and thighs. °HOME CARE INSTRUCTIONS  °· Keep ulcerated areas dry and clean. °· Take medications as directed. Antiviral medications can speed up healing. They will not prevent recurrences or cure this infection. These medications can also be taken for suppression if there are frequent recurrences. °· While the infection is active, it is contagious. Avoid all sexual contact during active infections. °· Condoms may help prevent spread of the herpes virus. °· Practice safe sex. °· Wash your hands thoroughly after touching the genital area. °· Avoid touching your eyes after touching your genital area. °· Inform your caregiver if you have had genital herpes and become pregnant. It is your responsibility to insure a safe outcome for your baby in this pregnancy. °· Only take over-the-counter or prescription medicines for pain, discomfort, or fever as directed by your caregiver. °SEEK MEDICAL CARE IF:  °· You have a recurrence of this infection. °· You do not respond to medications and are not  improving. °· You have new sources of pain or discharge which have changed from the original infection. °· You have an oral temperature above 102° F (38.9° C). °· You develop abdominal pain. °· You develop eye pain or signs of eye infection. °Document Released: 10/03/2000 Document Revised: 12/29/2011 Document Reviewed: 10/24/2009 °ExitCare® Patient Information ©2015 [employer]. This information is not intended to replace advice given to you by your health care provider. Make sure you discuss any questions you have with your health care provider. ° °

## 2015-04-02 LAB — HSV(HERPES SIMPLEX VRS) I + II AB-IGG
HSV 1 Glycoprotein G Ab, IgG: <0.1 IV
HSV 2 Glycoprotein G Ab, IgG: 1.31 IV — ABNORMAL HIGH

## 2015-04-02 LAB — HERPES SIMPLEX VIRUS CULTURE: Organism ID, Bacteria: DETECTED

## 2015-04-10 ENCOUNTER — Encounter: Payer: Self-pay | Admitting: Internal Medicine

## 2015-04-11 MED ORDER — VALACYCLOVIR HCL 1 G PO TABS: 1000.0000 mg | ORAL_TABLET | Freq: Two times a day (BID) | ORAL | Status: DC

## 2015-08-01 ENCOUNTER — Ambulatory Visit: Payer: Federal, State, Local not specified - PPO | Admitting: Obstetrics & Gynecology

## 2015-09-18 ENCOUNTER — Telehealth: Payer: Self-pay | Admitting: Obstetrics

## 2015-09-18 NOTE — Telephone Encounter (Signed)
09/18/2015 - Spoke with patient and she is stated she is going to see Dr. Delsa Sale. brm

## 2015-09-27 NOTE — Telephone Encounter (Signed)
Close encounter 

## 2015-10-24 ENCOUNTER — Other Ambulatory Visit: Payer: Self-pay

## 2015-10-24 DIAGNOSIS — Z1231 Encounter for screening mammogram for malignant neoplasm of breast: Secondary | ICD-10-CM

## 2015-11-15 ENCOUNTER — Ambulatory Visit
Admission: RE | Admit: 2015-11-15 | Discharge: 2015-11-15 | Disposition: A | Discharge status: Home / Self Care | Payer: Federal, State, Local not specified - PPO | Source: Ambulatory Visit

## 2015-11-15 DIAGNOSIS — Z1231 Encounter for screening mammogram for malignant neoplasm of breast: Secondary | ICD-10-CM

## 2016-02-04 DIAGNOSIS — Z3202 Encounter for pregnancy test, result negative: Secondary | ICD-10-CM | POA: Diagnosis not present

## 2016-02-04 DIAGNOSIS — N939 Abnormal uterine and vaginal bleeding, unspecified: Secondary | ICD-10-CM | POA: Diagnosis not present

## 2016-02-04 DIAGNOSIS — Z30430 Encounter for insertion of intrauterine contraceptive device: Secondary | ICD-10-CM | POA: Diagnosis not present

## 2016-03-03 DIAGNOSIS — K080 Exfoliation of teeth due to systemic causes: Secondary | ICD-10-CM | POA: Diagnosis not present

## 2016-03-06 DIAGNOSIS — M25562 Pain in left knee: Secondary | ICD-10-CM | POA: Diagnosis not present

## 2016-04-10 DIAGNOSIS — Z4651 Encounter for fitting and adjustment of gastric lap band: Secondary | ICD-10-CM | POA: Diagnosis not present

## 2016-05-08 DIAGNOSIS — Z4651 Encounter for fitting and adjustment of gastric lap band: Secondary | ICD-10-CM | POA: Diagnosis not present

## 2016-05-27 DIAGNOSIS — G8929 Other chronic pain: Secondary | ICD-10-CM | POA: Diagnosis not present

## 2016-05-27 DIAGNOSIS — M25562 Pain in left knee: Secondary | ICD-10-CM | POA: Diagnosis not present

## 2016-05-27 DIAGNOSIS — M1712 Unilateral primary osteoarthritis, left knee: Secondary | ICD-10-CM | POA: Diagnosis not present

## 2016-06-19 DIAGNOSIS — H9202 Otalgia, left ear: Secondary | ICD-10-CM | POA: Diagnosis not present

## 2016-07-10 DIAGNOSIS — Z4651 Encounter for fitting and adjustment of gastric lap band: Secondary | ICD-10-CM | POA: Diagnosis not present

## 2016-08-13 ENCOUNTER — Encounter: Payer: Federal, State, Local not specified - PPO | Attending: Family Medicine | Admitting: Dietician

## 2016-08-13 DIAGNOSIS — Z6841 Body Mass Index (BMI) 40.0 and over, adult: Secondary | ICD-10-CM | POA: Diagnosis not present

## 2016-08-13 NOTE — Progress Notes (Signed)
  Follow-up visit:  3 years Post-Operative LAGB Surgery  Medical Nutrition Therapy:  Appt start time: 0925 end time:  1015  Primary concerns today: Post-operative Bariatric Surgery Nutrition Management. "Monique Hamilton" is here today to discuss weight loss. Would like to lose another 20 pounds. Started running in college and cannot run any more due to knees. Weighed 170 in college and liked that weight. Gained weight with her 4 pregnancies. Tried many diets and weight fluctuated significantly. Had LAGB 3 years ago (had thought about it for 5 years). Highest weight was 274 lbs and lost 30 pounds before bariatric surgery. Has maintained her weight loss for years. States that she likes her band and has a good idea of when she needs a fill. Keeps a shake around for days she knows she will not able to eat as much. She drinks while eating. Unable to exercise currently due to knee pain (arthritis). Orthopedic MD suggested low impact exercise. Will eventually need a knee replacement. She works in [employer].   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   TANITA  BODY COMP RESULTS  08/13/16   BMI (kg/m^2) 31.3   Fat Mass (lbs) 76.2   Fat Free Mass (lbs) 120.6   Total Body Water (lbs) 86.4    Preferred Learning Style:   No preference indicated   Learning Readiness:   Ready  Change in progress  24-hr recall: B (AM): tea Snk (9-10AM): protein bar (15 g protein and 8-9 g carbs), coffee with sugar free creamer   L (11-2 PM): can of low carb chicken tortilla soup or salad or grilled nuggets Snk (PM): sometimes cheese or chips and salsa  D (PM): meat and vegetable and small amount of mac and cheese or beans Snk (PM): nuts and cheese   Fluid intake: tea, coffee, water  Medications: see list  Supplementation: taking   Recent physical activity:  None, inconsistent  Progress Towards Goal(s):  In  progress.  Handouts given during visit include:  none   Nutritional Diagnosis:  NB-1.1 Food and nutrition-related knowledge deficit As related to patient wishes to review bariatric diet recommendations .  As evidenced by patient report.    Intervention:  Nutrition counseling provided. -Continue to try to be active during the day -Think about some low impact exercise you can incorporate after the play is over -Try a deck of cards workout 1x a week -Preportion pistachios for your evening snack -Try not to drink while you eat (15 minutes before and 30 minutes after)  -Experiment with this and see how it affects your hunger/fullness -Increase water intake (figure out how to make this part of your day)   Teaching Method Utilized:  Visual Auditory Hands on  Barriers to learning/adherence to lifestyle change: knee pain and busy schedule  Demonstrated degree of understanding via:  Teach Back   Monitoring/Evaluation:  Dietary intake, exercise, lap band fills, and body weight. Follow up prn.

## 2016-08-13 NOTE — Patient Instructions (Addendum)
-  Continue to try to be active during the day -Think about some low impact exercise you can incorporate after the play is over -Try a deck of cards workout 1x a week -Preportion pistachios for your evening snack -Try not to drink while you eat (15 minutes before and 30 minutes after)  -Experiment with this and see how it affects your hunger/fullness -Increase water intake (figure out how to make this part of your day)

## 2016-08-14 ENCOUNTER — Encounter: Payer: Self-pay | Admitting: Dietician

## 2016-09-05 DIAGNOSIS — Z6831 Body mass index (BMI) 31.0-31.9, adult: Secondary | ICD-10-CM | POA: Diagnosis not present

## 2016-09-05 DIAGNOSIS — Z975 Presence of (intrauterine) contraceptive device: Secondary | ICD-10-CM | POA: Diagnosis not present

## 2016-10-07 DIAGNOSIS — K080 Exfoliation of teeth due to systemic causes: Secondary | ICD-10-CM | POA: Diagnosis not present

## 2016-11-25 ENCOUNTER — Other Ambulatory Visit: Payer: Self-pay | Admitting: Obstetrics & Gynecology

## 2016-11-25 DIAGNOSIS — Z1231 Encounter for screening mammogram for malignant neoplasm of breast: Secondary | ICD-10-CM

## 2016-11-28 ENCOUNTER — Ambulatory Visit (INDEPENDENT_AMBULATORY_CARE_PROVIDER_SITE_OTHER): Payer: Federal, State, Local not specified - PPO | Admitting: Pulmonary Disease

## 2016-11-28 ENCOUNTER — Encounter: Payer: Self-pay | Admitting: Pulmonary Disease

## 2016-11-28 VITALS — BP 122/84 | HR 64 | Ht 66.0 in | Wt 201.2 lb

## 2016-11-28 DIAGNOSIS — G4733 Obstructive sleep apnea (adult) (pediatric): Secondary | ICD-10-CM

## 2016-11-28 NOTE — Progress Notes (Signed)
Past Surgical History She  has a past surgical history that includes Knee surgery (12/2011); Wisdom tooth extraction (2004); Breath tek h pylori (N/A, 12/29/2012); Laparoscopic gastric banding (N/A, 06/28/2013); and Mesh applied to lap port (N/A, 06/28/2013).  Allergies  Allergen Reactions  . Lemon Flavor Other (See Comments)    headache    Family History Her family history includes Allergies in her mother; Breast cancer in her mother; Cancer in her maternal grandmother; Lung cancer in her paternal grandfather.  Social History She  reports that she has never smoked. She has never used smokeless tobacco. She reports that she drinks about 4.2 oz of alcohol per week . She reports that she does not use drugs.  Review of systems Headaches, Sore throat. Remainder of 12 point ROS negative.  Current Outpatient Prescriptions on File Prior to Visit  Medication Sig  . ibuprofen (ADVIL,MOTRIN) 200 MG tablet Take 600 mg by mouth every 6 (six) hours as needed for pain.   . Multiple Vitamin (MULTI VITAMIN DAILY PO) Take 1 tablet by mouth daily.  . valACYclovir (VALTREX) 1000 MG tablet Take 1 tablet (1,000 mg total) by mouth 2 (two) times daily.   No current facility-administered medications on file prior to visit.     Chief Complaint  Patient presents with  . Sleep Consult    Last seen 2014. Pt states that her husband reports snoring and choking sounds while she sleeps. Sleep study 2014 - never treated with CPAP as OSA was borderline.  Epworth Score: 3    Sleep tests PSG 12/15/12 >> AHI 5.9, SpO2 low 90%  Past medical history She  has a past medical history of Arthritis; Depression; Headache(784.0); Hepatitis (1994); History of cervical dysplasia; Morbid obesity ([place]); Nocturia; Sleep apnea; and Urgency of urination.  Vital signs BP 122/84 (BP Location: Left Arm, Cuff Size: Normal)   Pulse 64   Ht 5\' 6"  (1.676 m)   Wt 201 lb 3.2 oz (91.3 kg)   SpO2 100%   BMI 32.47 kg/m   History of  Present Illness Monique Hamilton is a 46 y.o. female for evaluation of sleep problems.  I saw her in 2014.  She had sleep study which showed mild sleep apnea.  She then had bariatric surgery and lost weight.  She didn't feel like she has sleep problem and didn't pursue therapy.  Her husband says her snoring has gotten worse, and she stops breathing occasionally while asleep.  She goes to sleep at 1030 pm.  She falls asleep quickly.  She wakes up occasionally to use the bathroom.  She gets out of bed at 545 am.  She feels tired in the morning.  She denies morning headache.  She does not use anything to help her fall sleep.  She drinks 2 cups of coffeed.  She denies sleep walking, sleep talking, bruxism, or nightmares.  There is no history of restless legs.  She denies sleep hallucinations, sleep paralysis, or cataplexy.  The Epworth score is 3 out of 24.   Physical Exam:  General - No distress ENT - No sinus tenderness, no oral exudate, no LAN, no thyromegaly, TM clear, pupils equal/reactive, MP 2 Cardiac - s1s2 regular, no murmur, pulses symmetric Chest - No wheeze/rales/dullness, good air entry, normal respiratory excursion Back - No focal tenderness Abd - Soft, non-tender, no organomegaly, + bowel sounds Ext - No edema Neuro - Normal strength, cranial nerves intact Skin - No rashes Psych - Normal mood, and behavior  Discussion: She has  snoring and her husband has concern that she could have sleep apnea.  She does not feel like she has an issue with her sleep.  She does not have history of cardiovascular disease.  Assessment/plan:  Snoring with concern for obstructive sleep apnea. - she will d/w her husband about whether she should pursue repeat sleep study now - discussed options of oral appliance, chin strap, and thera vent to be used as therapy for snoring >> explained these are not designed as therapy for sleep apnea - advised her to discuss with her dentist if she would like  to try oral appliance   Patient Instructions  Follow up in 6 months   Chesley Mires, M.D. Pager [phone] 11/28/2016, 10:59 AM

## 2016-11-28 NOTE — Patient Instructions (Signed)
Follow up in 6 months 

## 2016-12-09 ENCOUNTER — Ambulatory Visit
Admission: RE | Admit: 2016-12-09 | Discharge: 2016-12-09 | Disposition: A | Discharge status: Home / Self Care | Payer: Federal, State, Local not specified - PPO | Source: Ambulatory Visit | Attending: Obstetrics & Gynecology | Admitting: Obstetrics & Gynecology

## 2016-12-09 DIAGNOSIS — Z1231 Encounter for screening mammogram for malignant neoplasm of breast: Secondary | ICD-10-CM

## 2016-12-10 DIAGNOSIS — F331 Major depressive disorder, recurrent, moderate: Secondary | ICD-10-CM | POA: Diagnosis not present

## 2016-12-11 DIAGNOSIS — Z4651 Encounter for fitting and adjustment of gastric lap band: Secondary | ICD-10-CM | POA: Diagnosis not present

## 2016-12-12 DIAGNOSIS — F331 Major depressive disorder, recurrent, moderate: Secondary | ICD-10-CM | POA: Diagnosis not present

## 2016-12-26 DIAGNOSIS — F331 Major depressive disorder, recurrent, moderate: Secondary | ICD-10-CM | POA: Diagnosis not present

## 2017-01-13 DIAGNOSIS — F331 Major depressive disorder, recurrent, moderate: Secondary | ICD-10-CM | POA: Diagnosis not present

## 2017-01-23 DIAGNOSIS — F331 Major depressive disorder, recurrent, moderate: Secondary | ICD-10-CM | POA: Diagnosis not present

## 2017-02-03 DIAGNOSIS — F331 Major depressive disorder, recurrent, moderate: Secondary | ICD-10-CM | POA: Diagnosis not present

## 2017-02-17 DIAGNOSIS — Z0000 Encounter for general adult medical examination without abnormal findings: Secondary | ICD-10-CM | POA: Diagnosis not present

## 2017-02-17 DIAGNOSIS — E559 Vitamin D deficiency, unspecified: Secondary | ICD-10-CM | POA: Diagnosis not present

## 2017-02-20 DIAGNOSIS — F331 Major depressive disorder, recurrent, moderate: Secondary | ICD-10-CM | POA: Diagnosis not present

## 2017-02-24 DIAGNOSIS — F331 Major depressive disorder, recurrent, moderate: Secondary | ICD-10-CM | POA: Diagnosis not present

## 2017-03-31 DIAGNOSIS — F331 Major depressive disorder, recurrent, moderate: Secondary | ICD-10-CM | POA: Diagnosis not present

## 2017-04-29 DIAGNOSIS — F331 Major depressive disorder, recurrent, moderate: Secondary | ICD-10-CM | POA: Diagnosis not present

## 2017-05-20 DIAGNOSIS — F331 Major depressive disorder, recurrent, moderate: Secondary | ICD-10-CM | POA: Diagnosis not present

## 2017-05-20 DIAGNOSIS — K080 Exfoliation of teeth due to systemic causes: Secondary | ICD-10-CM | POA: Diagnosis not present

## 2017-06-23 DIAGNOSIS — F331 Major depressive disorder, recurrent, moderate: Secondary | ICD-10-CM | POA: Diagnosis not present

## 2017-10-05 DIAGNOSIS — F331 Major depressive disorder, recurrent, moderate: Secondary | ICD-10-CM | POA: Diagnosis not present

## 2017-10-30 DIAGNOSIS — F331 Major depressive disorder, recurrent, moderate: Secondary | ICD-10-CM | POA: Diagnosis not present

## 2017-11-02 DIAGNOSIS — F331 Major depressive disorder, recurrent, moderate: Secondary | ICD-10-CM | POA: Diagnosis not present

## 2017-11-08 ENCOUNTER — Telehealth: Payer: Self-pay | Admitting: Surgery

## 2017-11-08 NOTE — Telephone Encounter (Signed)
Patient status post lap band 5 years ago.  Has had to have fluid removed intermittently for the past year or so.    She called Sunday afternoon with complaints of 2 days of worsening dysphasia.  Difficulty to keep even liquids down.  Feeling a little lightheaded.  I strongly recommend she stick to a warm liquids only.  Try and focus on 1-2 ounces an hour.  See if she can keep that down.  Call the office Monday morning.  Most likely needs to be fit in urgently to have more if not all her fluid removed and regroup.  If she gets worsening lightheadedness or cannot keep anything down, she may need to come to the emergency room.    She would like to try and hold off an ER visit and hopefully can be seen urgently in the office.  I strongly recommend she call in the morning to get an appointment.  I will try and help encourage the office to call her in the morning as well.  Adin Hector, M.D., F.A.C.S. Gastrointestinal and Minimally Invasive Surgery [facility] [address] [place], [place] [address] [phone] Main / Paging

## 2017-11-11 DIAGNOSIS — Z4651 Encounter for fitting and adjustment of gastric lap band: Secondary | ICD-10-CM | POA: Diagnosis not present

## 2017-11-20 DIAGNOSIS — F331 Major depressive disorder, recurrent, moderate: Secondary | ICD-10-CM | POA: Diagnosis not present

## 2017-12-01 DIAGNOSIS — F331 Major depressive disorder, recurrent, moderate: Secondary | ICD-10-CM | POA: Diagnosis not present

## 2017-12-09 DIAGNOSIS — Z4651 Encounter for fitting and adjustment of gastric lap band: Secondary | ICD-10-CM | POA: Diagnosis not present

## 2017-12-10 DIAGNOSIS — F331 Major depressive disorder, recurrent, moderate: Secondary | ICD-10-CM | POA: Diagnosis not present

## 2017-12-21 ENCOUNTER — Other Ambulatory Visit: Payer: Self-pay | Admitting: Obstetrics & Gynecology

## 2017-12-21 DIAGNOSIS — Z1231 Encounter for screening mammogram for malignant neoplasm of breast: Secondary | ICD-10-CM

## 2018-01-08 ENCOUNTER — Ambulatory Visit
Admission: RE | Admit: 2018-01-08 | Discharge: 2018-01-08 | Disposition: A | Discharge status: Home / Self Care | Payer: Federal, State, Local not specified - PPO | Source: Ambulatory Visit | Attending: Obstetrics & Gynecology | Admitting: Obstetrics & Gynecology

## 2018-01-08 DIAGNOSIS — Z1231 Encounter for screening mammogram for malignant neoplasm of breast: Secondary | ICD-10-CM

## 2018-01-12 DIAGNOSIS — K080 Exfoliation of teeth due to systemic causes: Secondary | ICD-10-CM | POA: Diagnosis not present

## 2018-01-19 DIAGNOSIS — F331 Major depressive disorder, recurrent, moderate: Secondary | ICD-10-CM | POA: Diagnosis not present

## 2018-01-27 DIAGNOSIS — Z4651 Encounter for fitting and adjustment of gastric lap band: Secondary | ICD-10-CM | POA: Diagnosis not present

## 2018-02-01 ENCOUNTER — Ambulatory Visit (INDEPENDENT_AMBULATORY_CARE_PROVIDER_SITE_OTHER): Payer: Federal, State, Local not specified - PPO | Admitting: Pulmonary Disease

## 2018-02-01 ENCOUNTER — Encounter: Payer: Self-pay | Admitting: Pulmonary Disease

## 2018-02-01 VITALS — BP 110/78 | HR 65 | Ht 66.0 in | Wt 219.0 lb

## 2018-02-01 DIAGNOSIS — G4733 Obstructive sleep apnea (adult) (pediatric): Secondary | ICD-10-CM | POA: Diagnosis not present

## 2018-02-01 NOTE — Progress Notes (Signed)
[facility]  Chief Complaint  Patient presents with  . Follow-up    Pt is not on cpap machine, had sleep study in 2014. Pt is requesting another sleep study, not sleeping well, waking up not rested and snoring more.    Vital signs: BP 110/78 (BP Location: Left Arm, Cuff Size: Normal)   Pulse 65   Ht 5\' 6"  (1.676 m)   Wt 219 lb (99.3 kg)   SpO2 98%   BMI 35.35 kg/m   History of Present Illness: Monique Hamilton is a 47 y.o. female with obstructive sleep apnea.  She wasn't able to get oral appliance.  Wasn't able to get it set up through insurance.  Not sure if they tried to medical or dental insurance.  Having more trouble with her sleep.  Snoring more, and wakes up feeling like she can't breath.  Sleepy during the day, and having harder time staying focused when driving.  Feels more fatigued during the day.  Epworth score 11 out of 24.  Physical Exam:  General - pleasant Eyes - pupils reactive ENT - no sinus tenderness, no oral exudate, no LAN, MP 3, scalloped tongue Cardiac - regular, no murmur Chest - no wheeze, rales Abd - soft, non tender Ext - no edema Skin - no rashes Neuro - normal strength Psych - normal mood  Assessment/Plan:  Obstructive sleep apnea. - she has continued snoring, sleep disruption, apnea, and daytime sleepiness - she has hx of depression and her BMI is greater than 35  she was not able to get an oral appliance, and wants to try CPAP again - will arrange for home sleep study to assess current status of sleep apnea, and then likely arrange for auto CPAP set up   Patient Instructions  Will have you complete an Epworth Score today  Will arrange for home sleep study  Will call to arrange for follow up after sleep study reviewed     Chesley Mires, MD [place] 02/01/2018, 9:58 AM  Flow Sheet  Sleep tests: PSG 12/15/12 >> AHI 5.9, SpO2 low 90%  Past Medical History: She   has a past medical history of Arthritis, Depression, Headache(784.0), Hepatitis (1994), History of cervical dysplasia, Morbid obesity ([place]), Nocturia, Sleep apnea, and Urgency of urination.  Past Surgical History: She  has a past surgical history that includes Knee surgery (12/2011); Wisdom tooth extraction (2004); Breath tek h pylori (N/A, 12/29/2012); Laparoscopic gastric banding (N/A, 06/28/2013); and Mesh applied to lap port (N/A, 06/28/2013).  Family History: Her family history includes Allergies in her mother; Breast cancer in her maternal aunt and mother; Cancer in her maternal grandmother; Lung cancer in her paternal grandfather.  Social History: She  reports that she has never smoked. She has never used smokeless tobacco. She reports that she drinks about 4.2 oz of alcohol per week. She reports that she does not use drugs.  Medications: Allergies as of 02/01/2018      Reactions   Lemon Flavor Other (See Comments)   headache      Medication List        Accurate as of 02/01/18  9:58 AM. Always use your most recent med list.          Glucosamine-Chondroitin 1500-1200 MG/30ML Liqd Take by mouth daily.   ibuprofen 200 MG tablet Commonly known as:  ADVIL,MOTRIN Take 600 mg by mouth every 6 (six) hours as needed for pain.   levonorgestrel 20 MCG/24HR IUD Commonly  known as:  MIRENA 1 each by Intrauterine route once.   MULTI VITAMIN DAILY PO Take 1 tablet by mouth daily.

## 2018-02-01 NOTE — Patient Instructions (Signed)
Will have you complete an Epworth Score today  Will arrange for home sleep study  Will call to arrange for follow up after sleep study reviewed

## 2018-02-15 DIAGNOSIS — R300 Dysuria: Secondary | ICD-10-CM | POA: Diagnosis not present

## 2018-02-15 DIAGNOSIS — B349 Viral infection, unspecified: Secondary | ICD-10-CM | POA: Diagnosis not present

## 2018-02-15 DIAGNOSIS — R51 Headache: Secondary | ICD-10-CM | POA: Diagnosis not present

## 2018-02-19 DIAGNOSIS — Z9884 Bariatric surgery status: Secondary | ICD-10-CM | POA: Diagnosis not present

## 2018-02-23 DIAGNOSIS — F331 Major depressive disorder, recurrent, moderate: Secondary | ICD-10-CM | POA: Diagnosis not present

## 2018-03-02 DIAGNOSIS — Z131 Encounter for screening for diabetes mellitus: Secondary | ICD-10-CM | POA: Diagnosis not present

## 2018-03-02 DIAGNOSIS — Z0000 Encounter for general adult medical examination without abnormal findings: Secondary | ICD-10-CM | POA: Diagnosis not present

## 2018-03-02 DIAGNOSIS — E559 Vitamin D deficiency, unspecified: Secondary | ICD-10-CM | POA: Diagnosis not present

## 2018-03-02 DIAGNOSIS — F331 Major depressive disorder, recurrent, moderate: Secondary | ICD-10-CM | POA: Diagnosis not present

## 2018-03-09 DIAGNOSIS — G4733 Obstructive sleep apnea (adult) (pediatric): Secondary | ICD-10-CM | POA: Diagnosis not present

## 2018-03-10 ENCOUNTER — Telehealth: Payer: Self-pay | Admitting: Pulmonary Disease

## 2018-03-10 ENCOUNTER — Other Ambulatory Visit: Payer: Self-pay | Admitting: *Deleted

## 2018-03-10 DIAGNOSIS — G4733 Obstructive sleep apnea (adult) (pediatric): Secondary | ICD-10-CM

## 2018-03-10 NOTE — Telephone Encounter (Signed)
HST 03/09/18 >> AHI 12.1, SaO2 low 82%.   Will have my nurse inform pt that sleep study shows mild sleep apnea, and this has progressed since sleep study in 2014.  Options are 1) CPAP now, 2) ROV first.  If She is agreeable to CPAP, then please send order for auto CPAP range 5 to 15 cm H2O with heated humidity and mask of choice.  Have download sent 1 month after starting CPAP and set up ROV 2 months after starting CPAP.  ROV can be with me or NP.

## 2018-03-11 NOTE — Telephone Encounter (Signed)
Left voice mail on machine for patient to return phone call back regarding results. X1  

## 2018-03-12 DIAGNOSIS — G4733 Obstructive sleep apnea (adult) (pediatric): Secondary | ICD-10-CM | POA: Diagnosis not present

## 2018-03-16 NOTE — Telephone Encounter (Signed)
Left voice mail on machine for patient to return phone call back regarding results. X2 

## 2018-03-18 ENCOUNTER — Telehealth: Payer: Self-pay | Admitting: Pulmonary Disease

## 2018-03-18 DIAGNOSIS — G4733 Obstructive sleep apnea (adult) (pediatric): Secondary | ICD-10-CM

## 2018-03-18 NOTE — Telephone Encounter (Signed)
Information from previous message:  HST 03/09/18 >> AHI 12.1, SaO2 low 82%.   Will have my nurse inform pt that sleep study shows mild sleep apnea, and this has progressed since sleep study in 2014.  Options are 1) CPAP now, 2) ROV first.  If She is agreeable to CPAP, then please send order for auto CPAP range 5 to 15 cm H2O with heated humidity and mask of choice.  Have download sent 1 month after starting CPAP and set up ROV 2 months after starting CPAP.  ROV can be with me or NP.  Called and spoke with pt and relayed the results to her. Pt stated she would go ahead and start CPAP due to not getting any rest at night. 2 month follow up appt has been scheduled for pt and order has been placed for pt to begin cpap.  Nothing further needed at this time.

## 2018-03-18 NOTE — Telephone Encounter (Signed)
Left voice mail on machine for patient to return phone call back regarding results. X3 Per protocol, mailed letter today stating we have tried three times contacting pt, no answer. Nothing further needed at this time.

## 2018-03-29 DIAGNOSIS — G4733 Obstructive sleep apnea (adult) (pediatric): Secondary | ICD-10-CM | POA: Diagnosis not present

## 2018-04-28 DIAGNOSIS — G4733 Obstructive sleep apnea (adult) (pediatric): Secondary | ICD-10-CM | POA: Diagnosis not present

## 2018-05-12 ENCOUNTER — Encounter (HOSPITAL_COMMUNITY): Payer: Self-pay | Admitting: *Deleted

## 2018-05-12 ENCOUNTER — Emergency Department (HOSPITAL_COMMUNITY)
Admission: EM | Admit: 2018-05-12 | Discharge: 2018-05-13 | Disposition: A | Discharge status: Home / Self Care | Payer: Federal, State, Local not specified - PPO | Attending: Emergency Medicine | Admitting: Emergency Medicine

## 2018-05-12 DIAGNOSIS — R51 Headache: Secondary | ICD-10-CM | POA: Diagnosis not present

## 2018-05-12 DIAGNOSIS — R112 Nausea with vomiting, unspecified: Secondary | ICD-10-CM | POA: Insufficient documentation

## 2018-05-12 DIAGNOSIS — M25511 Pain in right shoulder: Secondary | ICD-10-CM | POA: Diagnosis not present

## 2018-05-12 DIAGNOSIS — Z79899 Other long term (current) drug therapy: Secondary | ICD-10-CM | POA: Insufficient documentation

## 2018-05-12 DIAGNOSIS — R110 Nausea: Secondary | ICD-10-CM | POA: Diagnosis not present

## 2018-05-12 DIAGNOSIS — R42 Dizziness and giddiness: Secondary | ICD-10-CM | POA: Diagnosis not present

## 2018-05-12 LAB — URINALYSIS, ROUTINE W REFLEX MICROSCOPIC
Bacteria, UA: NONE SEEN
Bilirubin Urine: NEGATIVE
GLUCOSE, UA: NEGATIVE mg/dL
Ketones, ur: 5 mg/dL — AB
Nitrite: NEGATIVE
PROTEIN: NEGATIVE mg/dL
Specific Gravity, Urine: 1.025 (ref 1.005–1.030)
pH: 5 (ref 5.0–8.0)

## 2018-05-12 LAB — CBC
HEMATOCRIT: 42.4 % (ref 36.0–46.0)
HEMOGLOBIN: 14.3 g/dL (ref 12.0–15.0)
MCH: 31.6 pg (ref 26.0–34.0)
MCHC: 33.7 g/dL (ref 30.0–36.0)
MCV: 93.6 fL (ref 78.0–100.0)
Platelets: 272 10*3/uL (ref 150–400)
RBC: 4.53 MIL/uL (ref 3.87–5.11)
RDW: 12.6 % (ref 11.5–15.5)
WBC: 7.9 10*3/uL (ref 4.0–10.5)

## 2018-05-12 LAB — BASIC METABOLIC PANEL
Anion gap: 8 (ref 5–15)
BUN: 11 mg/dL (ref 6–20)
CHLORIDE: 107 mmol/L (ref 98–111)
CO2: 26 mmol/L (ref 22–32)
Calcium: 10.4 mg/dL — ABNORMAL HIGH (ref 8.9–10.3)
Creatinine, Ser: 0.62 mg/dL (ref 0.44–1.00)
GFR calc Af Amer: >60 mL/min (ref >60)
GFR calc non Af Amer: >60 mL/min (ref >60)
Glucose, Bld: 109 mg/dL — ABNORMAL HIGH (ref 70–99)
Potassium: 3.9 mmol/L (ref 3.5–5.1)
Sodium: 141 mmol/L (ref 135–145)

## 2018-05-12 LAB — PREGNANCY, URINE: Preg Test, Ur: NEGATIVE

## 2018-05-12 MED ORDER — MECLIZINE HCL 25 MG PO TABS
25.0000 mg | ORAL_TABLET | Freq: Once | ORAL | Status: AC
  Administered 2018-05-12: 25 mg via ORAL
  Filled 2018-05-12: qty 1

## 2018-05-12 MED ORDER — MECLIZINE HCL 25 MG PO TABS: 25.0000 mg | ORAL_TABLET | Freq: Three times a day (TID) | ORAL | 0 refills | Status: DC | PRN

## 2018-05-12 NOTE — ED Triage Notes (Signed)
Pt sent [facility] for dizziness and right shoulder pain. Dizziness started this morning, right shoulder pain started last night. Pt's dizzy going from sitting up from lying position.

## 2018-05-12 NOTE — ED Provider Notes (Signed)
[facility] DEPT Provider Note   CSN: [id number] Arrival date & time: 05/12/18  1629     History   Chief Complaint Chief Complaint  Patient presents with  . Dizziness    HPI Monique Hamilton is a 47 y.o. female.  Patient is a 47 year old female with no significant past medical history who presents with dizziness.  She woke up this morning with a sensation that she was moving.  She describes this is a dizziness.  It is worse with head movements and going from standing to sitting or lying down.  It is not worse with standing particularly.  She has some associated nausea without vomiting.  She feels a little bit unsteady when she is walking.  She has not had a headache for most a day now she has a slight bifrontal type headache.  No posterior neck pain.  No numbness or weakness to her extremities.  No vision changes or speech deficits.  No history of recent head trauma.  No URI symptoms although she had some minor allergy symptoms.  She has been having some pain in her right shoulder.  It radiates a little bit down her right arm although is not radiating down her arm now.  Is worse with movement.  She denies any numbness or weakness in the hand.  This is been going on for the last 3 days.  She denies any known injury.  She states it feels muscular in nature.  She was seen in urgent care at [facility] walk-in clinic and sent here for further evaluation     Past Medical History:  Diagnosis Date  . Arthritis   . Depression    has SAD - seasonal only no meds  . Headache(784.0)    occasional migraines  . Hepatitis 1994   unsure of what type ( never showed up again)  . History of cervical dysplasia   . Morbid obesity ([place])   . Nocturia   . Sleep apnea    "mild" does not need c-pap  . Urgency of urination     Patient Active Problem List   Diagnosis Date Noted  . 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 Fitting and adjustment of gastric lap band 10/07/2013  .  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 Morbid obesity ([place]) 06/16/2013  . OSA (obstructive sleep apnea) 01/18/2013  . Arthritis 11/18/2012    Past Surgical History:  Procedure Laterality Date  . BREATH TEK H PYLORI N/A 12/29/2012   Procedure: BREATH TEK H PYLORI;  Surgeon: Pedro Earls, MD;  Location: [facility];  Service: General;  Laterality: N/A;  . KNEE SURGERY  12/2011   left  . LAPAROSCOPIC GASTRIC BANDING N/A 06/28/2013   Procedure: LAPAROSCOPIC GASTRIC BANDING;  Surgeon: Pedro Earls, MD;  Location: [facility];  Service: General;  Laterality: N/A;  . MESH APPLIED TO LAP PORT N/A 06/28/2013   Procedure: MESH APPLIED TO LAP PORT;  Surgeon: Pedro Earls, MD;  Location: [facility];  Service: General;  Laterality: N/A;  . WISDOM TOOTH EXTRACTION  2004     OB History    Gravida  5   Para  4   Term  4   Preterm      AB  1   Living  4     SAB  1   TAB      Ectopic      Multiple      Live Births  4  Home Medications    Prior to Admission medications   Medication Sig Start Date End Date Taking? Authorizing Provider  cholecalciferol (VITAMIN D) 1000 units tablet Take 1,000 Units by mouth daily.   Yes [provider]  FLUoxetine HCl 60 MG TABS Take 1 tablet by mouth every morning. 03/31/18  Yes [provider]  ibuprofen (ADVIL,MOTRIN) 200 MG tablet Take 600 mg by mouth every 6 (six) hours as needed for pain.    Yes [provider]  levonorgestrel (MIRENA) 20 MCG/24HR IUD 1 each by Intrauterine route once.   Yes [provider]  Multiple Vitamin (MULTI VITAMIN DAILY PO) Take 1 tablet by mouth daily.   Yes [provider]  meclizine (ANTIVERT) 25 MG tablet Take 1 tablet (25 mg total) by mouth 3 (three) times daily as needed for dizziness. 05/12/18   Malvin Johns, MD    Family History Family History  Problem Relation Age of Onset  . Breast cancer Mother   . Allergies Mother   . Lung cancer Paternal  Grandfather   . Cancer Maternal Grandmother   . Breast cancer Maternal Aunt     Social History Social History   Tobacco Use  . Smoking status: Never Smoker  . Smokeless tobacco: Never Used  Substance Use Topics  . Alcohol use: Yes    Alcohol/week: 4.2 oz    Types: 7 Glasses of wine per week    Comment: glass wine nightly and some on weekend  . Drug use: No     Allergies   Lemon flavor   Review of Systems Review of Systems  Constitutional: Negative for chills, diaphoresis, fatigue and fever.  HENT: Negative for congestion, rhinorrhea and sneezing.   Eyes: Negative.   Respiratory: Negative for cough, chest tightness and shortness of breath.   Cardiovascular: Negative for chest pain and leg swelling.  Gastrointestinal: Positive for nausea. Negative for abdominal pain, blood in stool, diarrhea and vomiting.  Genitourinary: Negative for difficulty urinating, flank pain, frequency and hematuria.  Musculoskeletal: Positive for arthralgias. Negative for back pain.  Skin: Negative for rash.  Neurological: Positive for dizziness. Negative for speech difficulty, weakness, numbness and headaches.     Physical Exam Updated Vital Signs BP (!) 154/93   Pulse 63   Temp 97.8 F (36.6 C) (Oral)   Resp 16   SpO2 98%   Physical Exam  Constitutional: She is oriented to person, place, and time. She appears well-developed and well-nourished.  HENT:  Head: Normocephalic and atraumatic.  Eyes: Pupils are equal, round, and reactive to light.  Positive horizontal nystagmus with fast component to the left.  No vertical or rotational nystagmus  Neck: Normal range of motion. Neck supple.  Cardiovascular: Normal rate, regular rhythm and normal heart sounds.  Pulmonary/Chest: Effort normal and breath sounds normal. No respiratory distress. She has no wheezes. She has no rales. She exhibits no tenderness.  Abdominal: Soft. Bowel sounds are normal. There is no tenderness. There is no rebound  and no guarding.  Musculoskeletal: Normal range of motion. She exhibits no edema.  Patient has some tenderness over the right trapezius muscle and little bit of the right cervical paraspinal area.  There is no spinal tenderness.  There is no pain in the anterior portion of the neck.  Lymphadenopathy:    She has no cervical adenopathy.  Neurological: She is alert and oriented to person, place, and time.  Motor 5/5 all extremities Sensation grossly intact to LT all extremities Finger to Nose intact,  no pronator drift CN II-XII grossly intact Gait normal   Skin: Skin is warm and dry. No rash noted.  Psychiatric: She has a normal mood and affect.     ED Treatments / Results  Labs (all labs ordered are listed, but only abnormal results are displayed) Labs Reviewed  BASIC METABOLIC PANEL - Abnormal; Notable for the following components:      Result Value   Glucose, Bld 109 (*)    Calcium 10.4 (*)    All other components within normal limits  URINALYSIS, ROUTINE W REFLEX MICROSCOPIC - Abnormal; Notable for the following components:   APPearance HAZY (*)    Hgb urine dipstick SMALL (*)    Ketones, ur 5 (*)    Leukocytes, UA TRACE (*)    All other components within normal limits  URINE CULTURE  CBC  PREGNANCY, URINE    EKG EKG Interpretation  Date/Time:  Wednesday May 12 2018 23:00:31 EDT Ventricular Rate:  57 PR Interval:    QRS Duration: 102 QT Interval:  469 QTC Calculation: 457 R Axis:   8 Text Interpretation:  Age not entered, assumed to be  47 years old for purpose of ECG interpretation Sinus rhythm Low voltage, precordial leads since last tracing no significant change Confirmed by Malvin Johns [phone]) on 05/12/2018 11:11:43 PM   Radiology No results found.  Procedures Procedures (including critical care time)  Medications Ordered in ED Medications  meclizine (ANTIVERT) tablet 25 mg (25 mg Oral Given 05/12/18 2254)     Initial Impression / Assessment and  Plan / ED Course  I have reviewed the triage vital signs and the nursing notes.  Pertinent labs & imaging results that were available during my care of the patient were reviewed by me and considered in my medical decision making (see chart for details).     Patient is a 47 year old female who presents with vertigo symptoms.  She has a minimal associated headache.  No neurologic deficits.  No recent head trauma.  She has some right-sided shoulder pain but it does not seem to be concerning for an arterial injury such as dissection.  It seems to be musculoskeletal in nature and possibly radiating from the cervical spine area.  She states that is very minimal discomfort currently.  Her labs are non-concerning.  Her urinalysis does not look consistent with infection but it was sent for culture.  She is able to ambulate without ataxia.  She has no suggestions of a central etiology for her vertigo.  She was started on meclizine and encouraged to have follow-up with her PCP.  Return precautions were given.  Final Clinical Impressions(s) / ED Diagnoses   Final diagnoses:  Vertigo    ED Discharge Orders        Ordered    meclizine (ANTIVERT) 25 MG tablet  3 times daily PRN     05/12/18 2344       Malvin Johns, MD 05/12/18 2348

## 2018-05-14 DIAGNOSIS — E559 Vitamin D deficiency, unspecified: Secondary | ICD-10-CM | POA: Diagnosis not present

## 2018-05-14 DIAGNOSIS — R42 Dizziness and giddiness: Secondary | ICD-10-CM | POA: Diagnosis not present

## 2018-05-14 DIAGNOSIS — R8271 Bacteriuria: Secondary | ICD-10-CM | POA: Diagnosis not present

## 2018-05-14 DIAGNOSIS — H938X2 Other specified disorders of left ear: Secondary | ICD-10-CM | POA: Diagnosis not present

## 2018-05-14 LAB — URINE CULTURE

## 2018-05-21 ENCOUNTER — Ambulatory Visit: Payer: Federal, State, Local not specified - PPO | Admitting: Pulmonary Disease

## 2018-05-21 ENCOUNTER — Encounter: Payer: Self-pay | Admitting: Pulmonary Disease

## 2018-05-21 VITALS — BP 130/78 | HR 76 | Ht 66.5 in | Wt 216.0 lb

## 2018-05-21 DIAGNOSIS — G4733 Obstructive sleep apnea (adult) (pediatric): Secondary | ICD-10-CM

## 2018-05-21 DIAGNOSIS — Z7189 Other specified counseling: Secondary | ICD-10-CM

## 2018-05-21 NOTE — Patient Instructions (Signed)
Can look up CPAP mask options on line  Follow up in 1 year 

## 2018-05-21 NOTE — Progress Notes (Signed)
[facility]  Chief Complaint  Patient presents with  . Follow-up    OSA, CPAP    Constitutional: BP 130/78 (BP Location: Left Arm, Cuff Size: Normal)   Pulse 76   Ht 5' 6.5" (1.689 m)   Wt 216 lb (98 kg)   SpO2 99%   BMI 34.34 kg/m   History of Present Illness: Monique Hamilton is a 47 y.o. female with obstructive sleep apnea.  She is slowing getting used to wearing CPAP.  She is tolerating pressure.  Feels that cool air is more comfortable, and doesn't use humidifier.  She is sleeping better and feels more energy.  Main issue is with straps for mask, and mask shifting with her hair.  She is not having sinus congestion, sore throat, dry mouth, or bloating.  Comprehensive Respiratory Exam:  Appearance - well kempt  ENMT - nasal mucosa moist, turbinates clear, mild deviation nasal septum, no dental lesions, no gingival bleeding, no oral exudates, no tonsillar hypertrophy, MP 3, scalloped tongue Neck - no masses, trachea midline, no thyromegaly, no elevation in JVP Respiratory - normal appearance of chest wall, normal respiratory effort w/o accessory muscle use, no wheezing or rales CV - s1s2 regular rate and rhythm, no murmurs, no peripheral edema, no varicosities, radial pulses symmetric GI - soft, non tender Lymph - no adenopathy noted in neck and axillary areas MSK - normal muscle strength and tone, normal gait Ext - no cyanosis, clubbing, or joint inflammation noted Skin - no rashes, lesions, or ulcers Neuro - oriented to person, place, and time Psych - normal mood and affect  Assessment/Plan:  Obstructive sleep apnea. - she is compliant with CPAP and reports benefit - continue auto CPAP - discussed options to assist with mask fit - she will call/email if she wants to switch masks - discussed how different stages of sleep can impact her sleep apnea - reviewed the role of CPAP humidifier >> she prefers to not use at this  time   Patient Instructions  Can look up CPAP mask options on line  Follow up in 1 year    Chesley Mires, MD [place] 05/21/2018, 9:15 AM  Flow Sheet  Sleep tests: PSG 12/15/12 >> AHI 5.9, SpO2 low 90% HST 03/09/18 >> AHI 12.1, SaO2 low 82% Auto CPAP 04/21/18 to 05/20/18 >> used on 24 of 30 nights with average 4 hrs 4 min.  Average AHI 0.5 with median CPAP 6 and 95 th percentile CPAP 8 cm H2O  Past Medical History: She  has a past medical history of Arthritis, Depression, Headache(784.0), Hepatitis (1994), History of cervical dysplasia, Morbid obesity ([place]), Nocturia, Sleep apnea, and Urgency of urination.  Past Surgical History: She  has a past surgical history that includes Knee surgery (12/2011); Wisdom tooth extraction (2004); Breath tek h pylori (N/A, 12/29/2012); Laparoscopic gastric banding (N/A, 06/28/2013); and Mesh applied to lap port (N/A, 06/28/2013).  Family History: Her family history includes Allergies in her mother; Breast cancer in her maternal aunt and mother; Cancer in her maternal grandmother; Lung cancer in her paternal grandfather.  Social History: She  reports that she has never smoked. She has never used smokeless tobacco. She reports that she drinks about 4.2 oz of alcohol per week. She reports that she does not use drugs.  Medications: Allergies as of 05/21/2018      Reactions   Lemon Flavor Other (See Comments)   headache      Medication List  Accurate as of 05/21/18  9:15 AM. Always use your most recent med list.          cholecalciferol 1000 units tablet Commonly known as:  VITAMIN D Take 1,000 Units by mouth daily.   FLUoxetine HCl 60 MG Tabs Take 1 tablet by mouth every morning.   ibuprofen 200 MG tablet Commonly known as:  ADVIL,MOTRIN Take 600 mg by mouth every 6 (six) hours as needed for pain.   levonorgestrel 20 MCG/24HR IUD Commonly known as:  MIRENA 1 each by Intrauterine route once.   MULTI VITAMIN DAILY  PO Take 1 tablet by mouth daily.

## 2018-05-29 DIAGNOSIS — G4733 Obstructive sleep apnea (adult) (pediatric): Secondary | ICD-10-CM | POA: Diagnosis not present

## 2018-06-29 DIAGNOSIS — G4733 Obstructive sleep apnea (adult) (pediatric): Secondary | ICD-10-CM | POA: Diagnosis not present

## 2018-07-30 ENCOUNTER — Other Ambulatory Visit: Payer: Self-pay | Admitting: Physician Assistant

## 2018-07-30 MED ORDER — FLUOXETINE HCL 60 MG PO TABS: 1.0000 | ORAL_TABLET | Freq: Every morning | ORAL | 5 refills | Status: DC

## 2018-08-09 ENCOUNTER — Encounter: Payer: Self-pay | Admitting: Emergency Medicine

## 2018-08-09 DIAGNOSIS — F331 Major depressive disorder, recurrent, moderate: Secondary | ICD-10-CM

## 2018-08-10 ENCOUNTER — Encounter: Payer: Self-pay | Admitting: Physician Assistant

## 2018-08-10 ENCOUNTER — Ambulatory Visit: Payer: Federal, State, Local not specified - PPO | Admitting: Physician Assistant

## 2018-08-10 DIAGNOSIS — G4733 Obstructive sleep apnea (adult) (pediatric): Secondary | ICD-10-CM

## 2018-08-10 DIAGNOSIS — F331 Major depressive disorder, recurrent, moderate: Secondary | ICD-10-CM | POA: Diagnosis not present

## 2018-08-10 MED ORDER — FLUOXETINE HCL 60 MG PO TABS: 60.0000 mg | ORAL_TABLET | Freq: Every morning | ORAL | 1 refills | Status: DC

## 2018-08-10 NOTE — Progress Notes (Signed)
[facility] Med [facility]  Patient ID: Monique Hamilton,  MRN: [id number]  PCP: Kathyrn Lass, MD  Date of Evaluation: 08/10/2018 Time spent:15 minutes   HISTORY/CURRENT STATUS: HPI patient presents, accompanied by her 75-month-old grandson, for 21-month recheck. She is been doing very well.  He is able to enjoy things, her energy and motivation are good.  No isolating.  No easy crying.  She is in a play right now and enjoying acting. Since her last visit she has a CPAP machine.  She is sleeping much better and feels better in the mornings when she gets up since using that.  She is had about 3 to 4 months.  She has known of the mild sleep apnea for a while but it got worse and this treatment has been added. Denies panic attacks or anxiety commonly. Work is going well, she owns her own business. She usually has worsening of depression in the wintertime.  She has purchased a therapy lab and plans to start using that soon.  Individual Medical History/ Review of Systems: Changes? :Yes got CPAP  Allergies: Lemon flavor  Current Medications:  Current Outpatient Medications:  .  cholecalciferol (VITAMIN D) 1000 units tablet, Take 1,000 Units by mouth daily., Disp: , Rfl:  .  FLUoxetine HCl 60 MG TABS, Take 60 mg by mouth every morning., Disp: 90 tablet, Rfl: 1 .  ibuprofen (ADVIL,MOTRIN) 200 MG tablet, Take 600 mg by mouth every 6 (six) hours as needed for pain. , Disp: , Rfl:  .  levonorgestrel (MIRENA) 20 MCG/24HR IUD, 1 each by Intrauterine route once., Disp: , Rfl:  .  loratadine (CLARITIN) 10 MG tablet, Take 10 mg by mouth daily., Disp: , Rfl:  .  Multiple Vitamin (MULTI VITAMIN DAILY PO), Take 1 tablet by mouth daily., Disp: , Rfl:  Medication Side Effects: None  Family Medical/ Social History: Changes? Yes dad, Parkinsons  MENTAL HEALTH EXAM:  There were no vitals taken for this visit.There is no height or weight on file to calculate BMI.  General Appearance: Well Groomed  overweight  Eye Contact:  Good  Speech:  Clear and Coherent  Volume:  Normal  Mood:  Euthymic  Affect:  Appropriate  Thought Process:  Goal Directed  Orientation:  Full (Time, Place, and Person)  Thought Content: Logical   Suicidal Thoughts:  No  Homicidal Thoughts:  No  Memory:  Immediate  Judgement:  Good  Insight:  Good  Psychomotor Activity:  Normal  Concentration:  Concentration: Good  Recall:  Good  Fund of Knowledge: Good  Language: Good  Akathisia:  No  AIMS (if indicated): not done  Assets:  Desire for Improvement  ADL's:  Intact  Cognition: WNL  Prognosis:  Good    DIAGNOSES:    ICD-10-CM   1. Major depressive disorder, recurrent episode, moderate (HCC) F33.1   2. Obstructive sleep apnea G47.33     RECOMMENDATIONS: She has bought a therapy lamp and will start using that after the time changes.  She will also try to walk more frequently. Continue Prozac as above.  If she starts feeling more blue, not wanting to do things, even with the medication, and the therapy lab, she can call and we will increase Prozac to 80 mg. Return in 3 months or sooner as needed.   Donnal Moat, PA-C

## 2018-09-06 DIAGNOSIS — S83011A Lateral subluxation of right patella, initial encounter: Secondary | ICD-10-CM | POA: Diagnosis not present

## 2018-09-06 DIAGNOSIS — M1711 Unilateral primary osteoarthritis, right knee: Secondary | ICD-10-CM | POA: Diagnosis not present

## 2018-09-20 DIAGNOSIS — Z6833 Body mass index (BMI) 33.0-33.9, adult: Secondary | ICD-10-CM | POA: Diagnosis not present

## 2018-09-20 DIAGNOSIS — Z01419 Encounter for gynecological examination (general) (routine) without abnormal findings: Secondary | ICD-10-CM | POA: Diagnosis not present

## 2018-09-29 DIAGNOSIS — K080 Exfoliation of teeth due to systemic causes: Secondary | ICD-10-CM | POA: Diagnosis not present

## 2018-10-28 ENCOUNTER — Ambulatory Visit: Payer: Federal, State, Local not specified - PPO | Admitting: Mental Health

## 2018-10-28 ENCOUNTER — Encounter: Payer: Self-pay | Admitting: Mental Health

## 2018-10-28 DIAGNOSIS — F331 Major depressive disorder, recurrent, moderate: Secondary | ICD-10-CM

## 2018-10-28 NOTE — Progress Notes (Signed)
      [facility] Counselor/Therapist Progress Note  Patient ID: Monique Hamilton, MRN: [id number],    Date: 10/28/2018  Time Spent: 45 minutes  Treatment Type: Individual Therapy  Reported Symptoms: overwhelmed, stressed. SAD. Finds winter weather depressing. Concerned about father's dx with Parkinsons. Gets annoyed with TV news on excessively at home.  Mental Status Exam:  Appearance:   Casual     Behavior:  Appropriate and Sharing  Motor:  Normal  Speech/Language:   Clear and Coherent  Affect:  Appropriate  Mood:  anxious and dysthymic  Thought process:  normal  Thought content:    WNL  Sensory/Perceptual disturbances:    WNL  Orientation:  oriented to person, place and time/date  Attention:  Good  Concentration:  Good  Memory:  WNL  Fund of knowledge:   Good  Insight:    Good  Judgment:   Good  Impulse Control:  Good   Risk Assessment: Danger to Self:  No Self-injurious Behavior: No Danger to Others: No Duty to Warn:no Physical Aggression / Violence:No  Access to Firearms a concern: No  Gang Involvement:No   Subjective:  Migraines not a problem currrently. Sleep is interrupted. Mood basically stable except for sadness about Father's Parkinsons diagnosis.  Interventions: Cognitive Behavioral Therapy, Solution-Oriented/Positive Psychology, Family Systems and Interpersonal  Diagnosis:   ICD-10-CM   1. Major depressive disorder, recurrent episode, moderate (HCC) F33.1     Plan:  Self care program            Internal locus of control            Watch negative or shaming self talk            Validation and support  [name], [facility]

## 2018-11-10 ENCOUNTER — Ambulatory Visit: Payer: Federal, State, Local not specified - PPO | Admitting: Physician Assistant

## 2018-11-10 ENCOUNTER — Encounter: Payer: Self-pay | Admitting: Physician Assistant

## 2018-11-10 DIAGNOSIS — F331 Major depressive disorder, recurrent, moderate: Secondary | ICD-10-CM

## 2018-11-10 DIAGNOSIS — F338 Other recurrent depressive disorders: Secondary | ICD-10-CM | POA: Diagnosis not present

## 2018-11-10 NOTE — Progress Notes (Signed)
[facility]  Patient ID: Monique Hamilton,  MRN: [id number]  PCP: Kathyrn Lass, MD  Date of Evaluation: 11/10/2018 Time spent:15 minutes  Chief Complaint:  Chief Complaint    Follow-up      HISTORY/CURRENT STATUS: HPI here for routine 66-month med check.  Overall she is doing very well.  A month or so ago she started feeling a little bit " blue" but that sky to be expected.  She has seasonal affective disorder.  She saw [name], [facility] a few weeks ago which helped.  Patient is also using her therapy lamp, not on a daily basis but frequently.  When she gets a good night sleep she feels better as well.  Work is going well.  She and her husband recently went on a cruise to the [place].  At this point she denies loss of interest in usual activities and is able to enjoy things.  Denies decreased energy or motivation.  Appetite has not changed.  No extreme sadness, tearfulness, or feelings of hopelessness.  Denies any changes in concentration, making decisions or remembering things.  Denies suicidal or homicidal thoughts.  She wonders if there ever may be a time when she can go off the Prozac during the summer and may be restarted and take only in the wintertime.  Individual Medical History/ Review of Systems: Changes? :No    Past medications for mental health diagnoses include: Effexor XR, Prozac  Allergies: Lemon flavor  Current Medications:  Current Outpatient Medications:  .  acetaminophen (TYLENOL) 650 MG CR tablet, Take 650 mg by mouth every 8 (eight) hours as needed for pain., Disp: , Rfl:  .  cholecalciferol (VITAMIN D) 1000 units tablet, Take 1,000 Units by mouth daily., Disp: , Rfl:  .  FLUoxetine HCl 60 MG TABS, Take 60 mg by mouth every morning., Disp: 90 tablet, Rfl: 1 .  levonorgestrel (MIRENA) 20 MCG/24HR IUD, 1 each by Intrauterine route once., Disp: , Rfl:  .  Multiple Vitamin (MULTI VITAMIN DAILY PO), Take 1 tablet by mouth daily., Disp: , Rfl:  .   ibuprofen (ADVIL,MOTRIN) 200 MG tablet, Take 600 mg by mouth every 6 (six) hours as needed for pain. , Disp: , Rfl:  .  loratadine (CLARITIN) 10 MG tablet, Take 10 mg by mouth daily., Disp: , Rfl:  Medication Side Effects: sexual dysfunction decreased libido  Family Medical/ Social History: Changes? No  MENTAL HEALTH EXAM:  There were no vitals taken for this visit.There is no height or weight on file to calculate BMI.  General Appearance: Casual and Well Groomed  Eye Contact:  Good  Speech:  Clear and Coherent  Volume:  Normal  Mood:  Euthymic  Affect:  Appropriate  Thought Process:  Goal Directed  Orientation:  Full (Time, Place, and Person)  Thought Content: Logical   Suicidal Thoughts:  No  Homicidal Thoughts:  No  Memory:  WNL  Judgement:  Good  Insight:  Good  Psychomotor Activity:  Normal  Concentration:  Concentration: Good  Recall:  Good  Fund of Knowledge: Good  Language: Good  Assets:  Desire for Improvement  ADL's:  Intact  Cognition: WNL  Prognosis:  Good    DIAGNOSES:    ICD-10-CM   1. Major depressive disorder, recurrent episode, moderate (HCC) F33.1   2. Seasonal affective disorder ([place]) F33.8     Receiving Psychotherapy: Yes With [name], LPC   RECOMMENDATIONS: Continue Prozac 60 mg p.o. daily.  We discussed whether she could possibly  take the Prozac only seasonally.  That is possible, although I really do not recommend it because after she goes off then she would need to restart it around August because it will take 4 to 6 weeks to become effective again.  She will think about it and let me know if she wants to go off.  She is aware that she can stop Prozac without weaning off. We discussed the sexual side effects.  Adding Wellbutrin as an option can be very beneficial but she prefers not to have to take 2 medications. Continue psychotherapy with [name], LPC. Continue therapy lamp.  Also other therapeutic lifestyle changes such as diet and  exercise were discussed. Return in 6 months or sooner as needed.   Donnal Moat, PA-C

## 2018-11-24 DIAGNOSIS — Z9884 Bariatric surgery status: Secondary | ICD-10-CM | POA: Diagnosis not present

## 2018-12-22 ENCOUNTER — Other Ambulatory Visit: Payer: Self-pay | Admitting: Obstetrics & Gynecology

## 2018-12-22 DIAGNOSIS — Z1231 Encounter for screening mammogram for malignant neoplasm of breast: Secondary | ICD-10-CM

## 2019-01-25 ENCOUNTER — Ambulatory Visit: Payer: Federal, State, Local not specified - PPO

## 2019-02-10 ENCOUNTER — Other Ambulatory Visit: Payer: Self-pay | Admitting: Physician Assistant

## 2019-02-14 DIAGNOSIS — G4733 Obstructive sleep apnea (adult) (pediatric): Secondary | ICD-10-CM | POA: Diagnosis not present

## 2019-03-09 ENCOUNTER — Other Ambulatory Visit: Payer: Self-pay | Admitting: Obstetrics & Gynecology

## 2019-03-09 ENCOUNTER — Other Ambulatory Visit: Payer: Self-pay

## 2019-03-09 ENCOUNTER — Ambulatory Visit
Admission: RE | Admit: 2019-03-09 | Discharge: 2019-03-09 | Disposition: A | Discharge status: Home / Self Care | Payer: Federal, State, Local not specified - PPO | Source: Ambulatory Visit | Attending: Obstetrics & Gynecology | Admitting: Obstetrics & Gynecology

## 2019-03-09 DIAGNOSIS — Z1231 Encounter for screening mammogram for malignant neoplasm of breast: Secondary | ICD-10-CM

## 2019-03-09 DIAGNOSIS — Z1159 Encounter for screening for other viral diseases: Secondary | ICD-10-CM | POA: Diagnosis not present

## 2019-03-09 DIAGNOSIS — N644 Mastodynia: Secondary | ICD-10-CM

## 2019-03-09 DIAGNOSIS — Z9884 Bariatric surgery status: Secondary | ICD-10-CM | POA: Diagnosis not present

## 2019-03-09 DIAGNOSIS — R5383 Other fatigue: Secondary | ICD-10-CM | POA: Diagnosis not present

## 2019-03-09 DIAGNOSIS — Z0000 Encounter for general adult medical examination without abnormal findings: Secondary | ICD-10-CM | POA: Diagnosis not present

## 2019-03-09 DIAGNOSIS — E559 Vitamin D deficiency, unspecified: Secondary | ICD-10-CM | POA: Diagnosis not present

## 2019-03-09 DIAGNOSIS — Z13220 Encounter for screening for lipoid disorders: Secondary | ICD-10-CM | POA: Diagnosis not present

## 2019-03-11 ENCOUNTER — Ambulatory Visit: Payer: Federal, State, Local not specified - PPO

## 2019-03-21 ENCOUNTER — Other Ambulatory Visit: Payer: Self-pay | Admitting: Family Medicine

## 2019-03-21 DIAGNOSIS — N644 Mastodynia: Secondary | ICD-10-CM

## 2019-03-28 ENCOUNTER — Other Ambulatory Visit: Payer: Self-pay

## 2019-03-28 ENCOUNTER — Ambulatory Visit
Admission: RE | Admit: 2019-03-28 | Discharge: 2019-03-28 | Disposition: A | Discharge status: Home / Self Care | Payer: Federal, State, Local not specified - PPO | Source: Ambulatory Visit | Attending: Family Medicine | Admitting: Family Medicine

## 2019-03-28 DIAGNOSIS — N6489 Other specified disorders of breast: Secondary | ICD-10-CM | POA: Diagnosis not present

## 2019-03-28 DIAGNOSIS — Z803 Family history of malignant neoplasm of breast: Secondary | ICD-10-CM | POA: Diagnosis not present

## 2019-03-28 DIAGNOSIS — N644 Mastodynia: Secondary | ICD-10-CM

## 2019-03-28 DIAGNOSIS — R928 Other abnormal and inconclusive findings on diagnostic imaging of breast: Secondary | ICD-10-CM | POA: Diagnosis not present

## 2019-05-09 ENCOUNTER — Encounter: Payer: Self-pay | Admitting: Physician Assistant

## 2019-05-09 ENCOUNTER — Other Ambulatory Visit: Payer: Self-pay

## 2019-05-09 ENCOUNTER — Ambulatory Visit (INDEPENDENT_AMBULATORY_CARE_PROVIDER_SITE_OTHER): Payer: Federal, State, Local not specified - PPO | Admitting: Physician Assistant

## 2019-05-09 DIAGNOSIS — F331 Major depressive disorder, recurrent, moderate: Secondary | ICD-10-CM

## 2019-05-09 MED ORDER — FLUOXETINE HCL 60 MG PO TABS: ORAL_TABLET | ORAL | 5 refills | Status: DC

## 2019-05-09 NOTE — Progress Notes (Signed)
[facility]  Patient ID: Monique Hamilton,  MRN: [id number]  PCP: Kathyrn Lass, MD  Date of Evaluation: 05/09/2019 Time spent:15 minutes  Chief Complaint:  Chief Complaint    Follow-up     Virtual Visit via Telephone Note  I connected with patient by a video enabled telemedicine application or telephone, with their informed consent, and verified patient privacy and that I am speaking with the correct person using two identifiers.  I am private, in my home and the patient is at home.  I discussed the limitations, risks, security and privacy concerns of performing an evaluation and management service by telephone and the availability of in person appointments. I also discussed with the patient that there may be a patient responsible charge related to this service. The patient expressed understanding and agreed to proceed.   I discussed the assessment and treatment plan with the patient. The patient was provided an opportunity to ask questions and all were answered. The patient agreed with the plan and demonstrated an understanding of the instructions.   The patient was advised to call back or seek an in-person evaluation if the symptoms worsen or if the condition fails to improve as anticipated.  I provided 15 minutes of non-face-to-face time during this encounter.  HISTORY/CURRENT STATUS: HPI For 6 month med check.    Doing really well.  Feels that the Prozac is still working well.  Her father was just diagnosed with Parkinson's disease and she gets a little teary when thinking about that.  But overall, she is doing well.  She had considered going off the Prozac at some point but she and her husband have discussed that and do not feel like this is a good time.  She is able to enjoy things.  Does not cry easily unless triggered.  Denies suicidal or homicidal thoughts.  Denies anxiety except occasionally when there is a reason to be anxious.  Work is going well.  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  Denies dizziness, syncope, seizures, numbness, tingling, tremor, tics, unsteady gait, slurred speech, confusion. Denies muscle or joint pain, stiffness, or dystonia.  Individual Medical History/ Review of Systems: Changes? :No   Allergies: Lemon flavor   Current Medications:  Current Outpatient Medications:  .  acetaminophen (TYLENOL) 650 MG CR tablet, Take 650 mg by mouth every 8 (eight) hours as needed for pain., Disp: , Rfl:  .  cholecalciferol (VITAMIN D) 1000 units tablet, Take 1,000 Units by mouth daily., Disp: , Rfl:  .  FLUoxetine HCl 60 MG TABS, TAKE 1 TABLET BY MOUTH EVERY DAY IN THE MORNING, Disp: 30 tablet, Rfl: 5 .  ibuprofen (ADVIL,MOTRIN) 200 MG tablet, Take 600 mg by mouth every 6 (six) hours as needed for pain. , Disp: , Rfl:  .  levonorgestrel (MIRENA) 20 MCG/24HR IUD, 1 each by Intrauterine route once., Disp: , Rfl:  .  Multiple Vitamin (MULTI VITAMIN DAILY PO), Take 1 tablet by mouth daily., Disp: , Rfl:  .  loratadine (CLARITIN) 10 MG tablet, Take 10 mg by mouth daily., Disp: , Rfl:  Medication Side Effects: none  Family Medical/ Social History: Changes? Yes her father was diagnosed with Parkinson's disease  MENTAL HEALTH EXAM:  There were no vitals taken for this visit.There is no height or weight on file to calculate BMI.  General Appearance: Unable to assess  Eye Contact:  Unable to assess  Speech:  Clear and Coherent  Volume:  Normal  Mood:  Euthymic  Affect:  Unable to assess  Thought Process:  Goal Directed  Orientation:  Full (Time, Place, and Person)  Thought Content: Logical   Suicidal Thoughts:  No  Homicidal Thoughts:  No  Memory:  WNL  Judgement:  Good  Insight:  Good  Psychomotor Activity:  Unable to assess  Concentration:  Concentration: Good  Recall:  Good  Fund of Knowledge: Good  Language: Good  Assets:  Desire for Improvement   ADL's:  Intact  Cognition: WNL  Prognosis:  Good    DIAGNOSES:    ICD-10-CM   1. Major depressive disorder, recurrent episode, moderate ([facility])  F33.1     Receiving Psychotherapy: No    RECOMMENDATIONS: Continue Prozac 60 mg p.o. daily.  I think it is wise not to make any changes at this time. Return in 6 months.  Donnal Moat, PA-C   This record has been created using [employer].  Chart creation errors have been sought, but may not always have been located and corrected. Such creation errors do not reflect on the standard of medical care.

## 2019-06-02 DIAGNOSIS — H6692 Otitis media, unspecified, left ear: Secondary | ICD-10-CM | POA: Diagnosis not present

## 2019-09-03 DIAGNOSIS — Z20828 Contact with and (suspected) exposure to other viral communicable diseases: Secondary | ICD-10-CM | POA: Diagnosis not present

## 2019-09-24 DIAGNOSIS — M25571 Pain in right ankle and joints of right foot: Secondary | ICD-10-CM | POA: Diagnosis not present

## 2019-11-17 DIAGNOSIS — M25572 Pain in left ankle and joints of left foot: Secondary | ICD-10-CM | POA: Diagnosis not present

## 2019-12-15 DIAGNOSIS — M25572 Pain in left ankle and joints of left foot: Secondary | ICD-10-CM | POA: Diagnosis not present

## 2020-01-02 DIAGNOSIS — K080 Exfoliation of teeth due to systemic causes: Secondary | ICD-10-CM | POA: Diagnosis not present

## 2020-01-04 DIAGNOSIS — Z4651 Encounter for fitting and adjustment of gastric lap band: Secondary | ICD-10-CM | POA: Diagnosis not present

## 2020-01-09 DIAGNOSIS — Z6834 Body mass index (BMI) 34.0-34.9, adult: Secondary | ICD-10-CM | POA: Diagnosis not present

## 2020-01-09 DIAGNOSIS — Z01419 Encounter for gynecological examination (general) (routine) without abnormal findings: Secondary | ICD-10-CM | POA: Diagnosis not present

## 2020-01-11 DIAGNOSIS — Z4651 Encounter for fitting and adjustment of gastric lap band: Secondary | ICD-10-CM | POA: Diagnosis not present

## 2020-03-09 DIAGNOSIS — E559 Vitamin D deficiency, unspecified: Secondary | ICD-10-CM | POA: Diagnosis not present

## 2020-03-09 DIAGNOSIS — R5383 Other fatigue: Secondary | ICD-10-CM | POA: Diagnosis not present

## 2020-03-09 DIAGNOSIS — Z13220 Encounter for screening for lipoid disorders: Secondary | ICD-10-CM | POA: Diagnosis not present

## 2020-03-09 DIAGNOSIS — Z0000 Encounter for general adult medical examination without abnormal findings: Secondary | ICD-10-CM | POA: Diagnosis not present

## 2020-03-09 DIAGNOSIS — Z9884 Bariatric surgery status: Secondary | ICD-10-CM | POA: Diagnosis not present

## 2020-03-13 DIAGNOSIS — S93402A Sprain of unspecified ligament of left ankle, initial encounter: Secondary | ICD-10-CM | POA: Diagnosis not present

## 2020-03-23 DIAGNOSIS — G4733 Obstructive sleep apnea (adult) (pediatric): Secondary | ICD-10-CM | POA: Diagnosis not present

## 2020-04-30 ENCOUNTER — Other Ambulatory Visit: Payer: Self-pay | Admitting: Family Medicine

## 2020-04-30 DIAGNOSIS — Z1231 Encounter for screening mammogram for malignant neoplasm of breast: Secondary | ICD-10-CM

## 2020-05-03 ENCOUNTER — Other Ambulatory Visit: Payer: Self-pay | Admitting: Physician Assistant

## 2020-05-03 NOTE — Telephone Encounter (Signed)
Last visit 04/2019 due back 6 months

## 2020-05-10 ENCOUNTER — Other Ambulatory Visit: Payer: Self-pay

## 2020-05-10 ENCOUNTER — Ambulatory Visit
Admission: RE | Admit: 2020-05-10 | Discharge: 2020-05-10 | Disposition: A | Discharge status: Home / Self Care | Payer: Federal, State, Local not specified - PPO | Source: Ambulatory Visit | Attending: Family Medicine | Admitting: Family Medicine

## 2020-05-10 DIAGNOSIS — Z1231 Encounter for screening mammogram for malignant neoplasm of breast: Secondary | ICD-10-CM

## 2020-06-04 ENCOUNTER — Other Ambulatory Visit: Payer: Self-pay | Admitting: Physician Assistant

## 2020-06-15 DIAGNOSIS — Z20828 Contact with and (suspected) exposure to other viral communicable diseases: Secondary | ICD-10-CM | POA: Diagnosis not present

## 2020-06-18 ENCOUNTER — Telehealth: Payer: Self-pay | Admitting: Physician Assistant

## 2020-06-18 NOTE — Telephone Encounter (Signed)
Yes, please pend

## 2020-06-18 NOTE — Telephone Encounter (Signed)
Pt called asking about Prozac RF. She did make appt on Sept 30 ( next avail), and was told it was important for her to come to that. Please RF to [facility] on [address] for her.

## 2020-06-18 NOTE — Telephone Encounter (Signed)
Scheduled 07/19/20  Last apt 04/2019  Refill okay till apt?

## 2020-06-19 ENCOUNTER — Other Ambulatory Visit: Payer: Self-pay

## 2020-06-19 MED ORDER — FLUOXETINE HCL 60 MG PO TABS: ORAL_TABLET | ORAL | 0 refills | Status: DC

## 2020-06-19 NOTE — Telephone Encounter (Signed)
Rx sent 

## 2020-07-03 DIAGNOSIS — Z20828 Contact with and (suspected) exposure to other viral communicable diseases: Secondary | ICD-10-CM | POA: Diagnosis not present

## 2020-07-19 ENCOUNTER — Ambulatory Visit (INDEPENDENT_AMBULATORY_CARE_PROVIDER_SITE_OTHER): Payer: Federal, State, Local not specified - PPO | Admitting: Physician Assistant

## 2020-07-19 ENCOUNTER — Encounter: Payer: Self-pay | Admitting: Physician Assistant

## 2020-07-19 ENCOUNTER — Other Ambulatory Visit: Payer: Self-pay

## 2020-07-19 DIAGNOSIS — F3342 Major depressive disorder, recurrent, in full remission: Secondary | ICD-10-CM | POA: Diagnosis not present

## 2020-07-19 MED ORDER — FLUOXETINE HCL 20 MG PO CAPS: 20.0000 mg | ORAL_CAPSULE | Freq: Every day | ORAL | 1 refills | Status: DC

## 2020-07-19 NOTE — Progress Notes (Signed)
[facility]  Patient ID: Monique Hamilton,  MRN: [id number]  PCP: Kathyrn Lass, MD  Date of Evaluation: 07/19/2020 Time spent:20 minutes  Chief Complaint:  Chief Complaint    Follow-up      HISTORY/CURRENT STATUS: HPI For med check.    Wasn't able to get the Prozac refilled and was without the med for 2 months. She also went off of her hormone patch at the same time.  Her pharmacy was giving her problems is the reason for these issues.  When she did get the Prozac refilled, she did not start it back because she wanted to discuss it with me at this appointment today.  Overall she has been doing very well.  She has noticed that she has been a bit more irritable since being off of the Prozac and the estrogen replacement.  She did start back with the estrogen though. she prefers not to go back on the Prozac unless she has to.  She is able to enjoy things.  She is not isolating.  Energy and motivation are good.  She is working without problems.  Sleep is normal most of the time.  Libido increased after stopping the Prozac.  Not having anxiety for the most part.  She does have 3 teenagers still living at home so that can cause anxious moments but otherwise no complaints there.  No suicidal or homicidal thoughts.  Patient denies increased energy with decreased need for sleep, no increased talkativeness, no racing thoughts, no impulsivity or risky behaviors, no increased spending, no increased libido, no grandiosity, no increased irritability or anger, and no hallucinations.  Denies dizziness, syncope, seizures, numbness, tingling, tremor, tics, unsteady gait, slurred speech, confusion. Denies muscle or joint pain, stiffness, or dystonia.  Individual Medical History/ Review of Systems: Changes? :No    Past medications for mental health diagnoses include: Prozac  Allergies: Lemon flavor   Current Medications:  Current Outpatient Medications:  .  acetaminophen (TYLENOL)  650 MG CR tablet, Take 650 mg by mouth every 8 (eight) hours as needed for pain., Disp: , Rfl:  .  cholecalciferol (VITAMIN D) 1000 units tablet, Take 1,000 Units by mouth daily., Disp: , Rfl:  .  estradiol (CLIMARA) 0.06 MG/24HR, Place onto the skin., Disp: , Rfl:  .  ferrous sulfate 325 (65 FE) MG tablet, Take 325 mg by mouth daily with breakfast., Disp: , Rfl:  .  ibuprofen (ADVIL,MOTRIN) 200 MG tablet, Take 600 mg by mouth every 6 (six) hours as needed for pain. , Disp: , Rfl:  .  levonorgestrel (MIRENA) 20 MCG/24HR IUD, 1 each by Intrauterine route once., Disp: , Rfl:  .  loratadine (CLARITIN) 10 MG tablet, Take 10 mg by mouth daily., Disp: , Rfl:  .  Multiple Vitamin (MULTI VITAMIN DAILY PO), Take 1 tablet by mouth daily., Disp: , Rfl:  .  FLUoxetine (PROZAC) 20 MG capsule, Take 1 capsule (20 mg total) by mouth daily., Disp: 30 capsule, Rfl: 1 Medication Side Effects: none  Family Medical/ Social History: Changes?  No  MENTAL HEALTH EXAM:  There were no vitals taken for this visit.There is no height or weight on file to calculate BMI.  General Appearance: Casual, Neat and Well Groomed  Eye Contact:  Good  Speech:  Clear and Coherent  Volume:  Normal  Mood:  Euthymic  Affect:  Appropriate  Thought Process:  Goal Directed  Orientation:  Full (Time, Place, and Person)  Thought Content: Logical   Suicidal Thoughts:  No  Homicidal Thoughts:  No  Memory:  WNL  Judgement:  Good  Insight:  Good  Psychomotor Activity:  Normal  Concentration:  Concentration: Good  Recall:  Good  Fund of Knowledge: Good  Language: Good  Assets:  Desire for Improvement  ADL's:  Intact  Cognition: WNL  Prognosis:  Good    DIAGNOSES:    ICD-10-CM   1. Recurrent major depressive disorder, in full remission ([place])  F33.42     Receiving Psychotherapy: No    RECOMMENDATIONS: PDMP was reviewed. I provided 20 minutes of face-to-face time during this encounter. We discussed the Prozac.  Since she  is doing well as far as any depressive symptoms go, and prefers not to go back on it, that is fine.  If she does start having any symptoms of depression, she will need to start it back at a lower dose.  She sometimes gets blue around February or around that time if it happens at all.  I reminded her that the Prozac will likely take a month to 6 weeks to become effective so if she starts getting any symptoms around December or so go ahead and start it.  And then she will need to see me 6 weeks after starting the Prozac.  If she does not start it, she can return as needed. Restart Prozac 20 mg 1 daily, only if she needs it.  Instructions for pharmacy to hold med until patient requests. Return as needed.  Donnal Moat, PA-C

## 2020-08-07 DIAGNOSIS — Z1159 Encounter for screening for other viral diseases: Secondary | ICD-10-CM | POA: Diagnosis not present

## 2020-08-10 DIAGNOSIS — K648 Other hemorrhoids: Secondary | ICD-10-CM | POA: Diagnosis not present

## 2020-08-10 DIAGNOSIS — D123 Benign neoplasm of transverse colon: Secondary | ICD-10-CM | POA: Diagnosis not present

## 2020-08-10 DIAGNOSIS — D124 Benign neoplasm of descending colon: Secondary | ICD-10-CM | POA: Diagnosis not present

## 2020-08-10 DIAGNOSIS — Z1211 Encounter for screening for malignant neoplasm of colon: Secondary | ICD-10-CM | POA: Diagnosis not present

## 2020-08-10 DIAGNOSIS — D122 Benign neoplasm of ascending colon: Secondary | ICD-10-CM | POA: Diagnosis not present

## 2020-09-18 ENCOUNTER — Other Ambulatory Visit: Payer: Federal, State, Local not specified - PPO

## 2020-09-18 DIAGNOSIS — Z20822 Contact with and (suspected) exposure to covid-19: Secondary | ICD-10-CM | POA: Diagnosis not present

## 2020-09-19 LAB — NOVEL CORONAVIRUS, NAA: SARS-CoV-2, NAA: DETECTED — AB

## 2020-09-19 LAB — SARS-COV-2, NAA 2 DAY TAT

## 2020-09-20 ENCOUNTER — Telehealth (HOSPITAL_COMMUNITY): Payer: Self-pay | Admitting: Family

## 2020-09-20 ENCOUNTER — Telehealth: Payer: Self-pay | Admitting: Nurse Practitioner

## 2020-09-20 ENCOUNTER — Encounter: Payer: Self-pay | Admitting: Nurse Practitioner

## 2020-09-20 DIAGNOSIS — U071 COVID-19: Secondary | ICD-10-CM

## 2020-09-20 NOTE — Telephone Encounter (Signed)
Called to Discuss with patient about Covid symptoms and the use of a monoclonal antibody infusion for those with mild to moderate Covid symptoms and at a high risk of hospitalization.     Pt is qualified for this infusion at the [facility] due to co-morbid conditions and/or a member of an at-risk group.     Unable to reach pt. Left message to return call. Sent [employer].   Beckey Rutter, [place], AGNP-C [phone] ([place])

## 2020-09-20 NOTE — Telephone Encounter (Signed)
Called to discuss with Monique Hamilton about Covid symptoms and potential candidacy for the use of sotrovimab, a combination monoclonal antibody infusion for those with mild to moderate Covid symptoms and at a high risk of hospitalization.     Pt is qualified for this infusion at the infusion center due to co-morbid conditions and/or a member of an at-risk group, however unable to reach patient. VM left.   Jeremy Mclamb,NP

## 2020-12-02 DIAGNOSIS — J0100 Acute maxillary sinusitis, unspecified: Secondary | ICD-10-CM | POA: Diagnosis not present

## 2020-12-02 DIAGNOSIS — J069 Acute upper respiratory infection, unspecified: Secondary | ICD-10-CM | POA: Diagnosis not present

## 2020-12-06 DIAGNOSIS — J0100 Acute maxillary sinusitis, unspecified: Secondary | ICD-10-CM | POA: Diagnosis not present

## 2020-12-06 DIAGNOSIS — J00 Acute nasopharyngitis [common cold]: Secondary | ICD-10-CM | POA: Diagnosis not present

## 2020-12-20 DIAGNOSIS — J343 Hypertrophy of nasal turbinates: Secondary | ICD-10-CM | POA: Diagnosis not present

## 2020-12-20 DIAGNOSIS — J310 Chronic rhinitis: Secondary | ICD-10-CM | POA: Diagnosis not present

## 2020-12-20 DIAGNOSIS — J342 Deviated nasal septum: Secondary | ICD-10-CM | POA: Diagnosis not present

## 2020-12-20 DIAGNOSIS — R438 Other disturbances of smell and taste: Secondary | ICD-10-CM | POA: Diagnosis not present

## 2021-03-12 DIAGNOSIS — E559 Vitamin D deficiency, unspecified: Secondary | ICD-10-CM | POA: Diagnosis not present

## 2021-03-12 DIAGNOSIS — Z13220 Encounter for screening for lipoid disorders: Secondary | ICD-10-CM | POA: Diagnosis not present

## 2021-03-12 DIAGNOSIS — R350 Frequency of micturition: Secondary | ICD-10-CM | POA: Diagnosis not present

## 2021-03-12 DIAGNOSIS — Z86010 Personal history of colonic polyps: Secondary | ICD-10-CM | POA: Diagnosis not present

## 2021-03-12 DIAGNOSIS — Z9884 Bariatric surgery status: Secondary | ICD-10-CM | POA: Diagnosis not present

## 2021-03-12 DIAGNOSIS — Z0000 Encounter for general adult medical examination without abnormal findings: Secondary | ICD-10-CM | POA: Diagnosis not present

## 2021-05-01 DIAGNOSIS — T162XXA Foreign body in left ear, initial encounter: Secondary | ICD-10-CM | POA: Diagnosis not present

## 2021-05-09 ENCOUNTER — Other Ambulatory Visit: Payer: Self-pay | Admitting: Family Medicine

## 2021-05-09 DIAGNOSIS — Z1231 Encounter for screening mammogram for malignant neoplasm of breast: Secondary | ICD-10-CM

## 2021-05-15 DIAGNOSIS — Z9884 Bariatric surgery status: Secondary | ICD-10-CM | POA: Diagnosis not present

## 2021-05-15 DIAGNOSIS — Z4651 Encounter for fitting and adjustment of gastric lap band: Secondary | ICD-10-CM | POA: Diagnosis not present

## 2021-07-01 ENCOUNTER — Other Ambulatory Visit: Payer: Self-pay

## 2021-07-01 ENCOUNTER — Ambulatory Visit
Admission: RE | Admit: 2021-07-01 | Discharge: 2021-07-01 | Disposition: A | Discharge status: Home / Self Care | Payer: Federal, State, Local not specified - PPO | Source: Ambulatory Visit | Attending: Family Medicine | Admitting: Family Medicine

## 2021-07-01 DIAGNOSIS — Z1231 Encounter for screening mammogram for malignant neoplasm of breast: Secondary | ICD-10-CM

## 2021-07-08 ENCOUNTER — Telehealth: Payer: Self-pay | Admitting: Physician Assistant

## 2021-07-08 NOTE — Telephone Encounter (Signed)
Pt called and advised that Helene Kelp wanted to know if she started back taking Prozac.  She advised that she has, but she has a '60mg'$  pill that she is breaking in half and taking '30mg'$ .  Her chart only seems to show a '20mg'$  pill.    Next appt is 10/24

## 2021-07-08 NOTE — Telephone Encounter (Signed)
Please review

## 2021-07-08 NOTE — Telephone Encounter (Signed)
The Prozac prescription that I sent in last September is still good.  Contact her pharmacy and get that filled.  She should only be on 20 mg daily.

## 2021-07-09 ENCOUNTER — Other Ambulatory Visit: Payer: Self-pay

## 2021-07-09 MED ORDER — FLUOXETINE HCL 20 MG PO CAPS: 20.0000 mg | ORAL_CAPSULE | Freq: Every day | ORAL | 0 refills | Status: DC

## 2021-07-15 DIAGNOSIS — F331 Major depressive disorder, recurrent, moderate: Secondary | ICD-10-CM | POA: Diagnosis not present

## 2021-08-01 ENCOUNTER — Ambulatory Visit (INDEPENDENT_AMBULATORY_CARE_PROVIDER_SITE_OTHER): Payer: Federal, State, Local not specified - PPO | Admitting: Physician Assistant

## 2021-08-01 ENCOUNTER — Encounter: Payer: Self-pay | Admitting: Physician Assistant

## 2021-08-01 ENCOUNTER — Other Ambulatory Visit: Payer: Self-pay

## 2021-08-01 DIAGNOSIS — F331 Major depressive disorder, recurrent, moderate: Secondary | ICD-10-CM | POA: Diagnosis not present

## 2021-08-01 MED ORDER — FLUOXETINE HCL 40 MG PO CAPS: 40.0000 mg | ORAL_CAPSULE | Freq: Every day | ORAL | 1 refills | Status: DC

## 2021-08-01 NOTE — Progress Notes (Signed)
[facility]  Patient ID: Monique Hamilton,  MRN: [id number]  PCP: Kathyrn Lass, MD  Date of Evaluation: 08/01/2021 Time spent:20 minutes  Chief Complaint:  Chief Complaint   Depression; Follow-up     HISTORY/CURRENT STATUS: HPI not doing well.  Has not been seen in a little over a year.  At that point she was doing well and wanted to go off the Prozac so we did.  She did fine for a long time but then several months ago she started feeling more blue, more irritable easy to be frustrated, sad, overwhelmed, energy and motivation have been lacking as well.  She sleeps okay and has not been sleeping too much.  Appetite is normal and weight is stable.  Was started back on Prozac 20 mg about 3 weeks ago.  She is already feeling slightly better.  No suicidal or homicidal thoughts.  Patient denies increased energy with decreased need for sleep, no increased talkativeness, no racing thoughts, no impulsivity or risky behaviors, no increased spending, no increased libido, no grandiosity, no increased irritability or anger, and no hallucinations.  Denies dizziness, syncope, seizures, numbness, tingling, tremor, tics, unsteady gait, slurred speech, confusion. Denies muscle or joint pain, stiffness, or dystonia.  Individual Medical History/ Review of Systems: Changes? :No    Past medications for mental health diagnoses include: Prozac  Allergies: Lemon flavor   Current Medications:  Current Outpatient Medications:    acetaminophen (TYLENOL) 650 MG CR tablet, Take 650 mg by mouth every 8 (eight) hours as needed for pain., Disp: , Rfl:    Apoaequorin (PREVAGEN PO), Take by mouth., Disp: , Rfl:    FLUoxetine (PROZAC) 40 MG capsule, Take 1 capsule (40 mg total) by mouth daily., Disp: 30 capsule, Rfl: 1   ibuprofen (ADVIL,MOTRIN) 200 MG tablet, Take 600 mg by mouth every 6 (six) hours as needed for pain. , Disp: , Rfl:    levonorgestrel (MIRENA) 20 MCG/24HR IUD, 1 each by  Intrauterine route once., Disp: , Rfl:    cholecalciferol (VITAMIN D) 1000 units tablet, Take 1,000 Units by mouth daily. (Patient not taking: Reported on 08/01/2021), Disp: , Rfl:    estradiol (CLIMARA) 0.06 MG/24HR, Place onto the skin., Disp: , Rfl:    ferrous sulfate 325 (65 FE) MG tablet, Take 325 mg by mouth daily with breakfast. (Patient not taking: Reported on 08/01/2021), Disp: , Rfl:    loratadine (CLARITIN) 10 MG tablet, Take 10 mg by mouth daily. (Patient not taking: Reported on 08/01/2021), Disp: , Rfl:    Multiple Vitamin (MULTI VITAMIN DAILY PO), Take 1 tablet by mouth daily. (Patient not taking: Reported on 08/01/2021), Disp: , Rfl:  Medication Side Effects: none  Family Medical/ Social History: Changes?  Dad died in 10-14-20 from [place]:  There were no vitals taken for this visit.There is no height or weight on file to calculate BMI.  General Appearance: Casual, Neat and Well Groomed  Eye Contact:  Good  Speech:  Clear and Coherent  Volume:  Normal  Mood:  Depressed  Affect:  Congruent  Thought Process:  Goal Directed  Orientation:  Full (Time, Place, and Person)  Thought Content: Logical   Suicidal Thoughts:  No  Homicidal Thoughts:  No  Memory:  WNL  Judgement:  Good  Insight:  Good  Psychomotor Activity:  Normal  Concentration:  Concentration: Good  Recall:  Good  Fund of Knowledge: Good  Language: Good  Assets:  Desire for Improvement  ADL's:  Intact  Cognition: WNL  Prognosis:  Good    DIAGNOSES:    ICD-10-CM   1. Major depressive disorder, recurrent episode, moderate ([place])  F33.1        Receiving Psychotherapy: Yes   Aris Lot,    RECOMMENDATIONS: PDMP was reviewed. No results available.  I provided 20 minutes of face to face time during this encounter, including time spent before and after the visit in records review, medical decision making, and charting.  She was on a maximum of Prozac 60 mg in the past.   Recommend increasing the Prozac after she finishes this months worth (only has 1 week left) of 20 mg. Continue Prozac 20 mg daily for 1 week, then increase to 40 mg daily. She will continue counseling, but states she is not sure if it is a good fit yet or not.  She will go back a few more times to see how it goes.  She saw [name] in the past and they connected well.  I gave her Evelena Peat Deussing's name if she does need to consider someone else. Return in 5 to 6 weeks.  Donnal Moat, PA-C

## 2021-08-06 DIAGNOSIS — N3941 Urge incontinence: Secondary | ICD-10-CM | POA: Diagnosis not present

## 2021-08-12 ENCOUNTER — Ambulatory Visit: Payer: Federal, State, Local not specified - PPO | Admitting: Physician Assistant

## 2021-08-13 DIAGNOSIS — M1990 Unspecified osteoarthritis, unspecified site: Secondary | ICD-10-CM | POA: Diagnosis not present

## 2021-08-13 DIAGNOSIS — M6281 Muscle weakness (generalized): Secondary | ICD-10-CM | POA: Diagnosis not present

## 2021-08-13 DIAGNOSIS — R3915 Urgency of urination: Secondary | ICD-10-CM | POA: Diagnosis not present

## 2021-08-13 DIAGNOSIS — N3941 Urge incontinence: Secondary | ICD-10-CM | POA: Diagnosis not present

## 2021-08-21 DIAGNOSIS — F320 Major depressive disorder, single episode, mild: Secondary | ICD-10-CM | POA: Diagnosis not present

## 2021-08-27 DIAGNOSIS — M6281 Muscle weakness (generalized): Secondary | ICD-10-CM | POA: Diagnosis not present

## 2021-08-27 DIAGNOSIS — N3941 Urge incontinence: Secondary | ICD-10-CM | POA: Diagnosis not present

## 2021-09-04 DIAGNOSIS — F320 Major depressive disorder, single episode, mild: Secondary | ICD-10-CM | POA: Diagnosis not present

## 2021-09-10 ENCOUNTER — Ambulatory Visit (INDEPENDENT_AMBULATORY_CARE_PROVIDER_SITE_OTHER): Payer: Federal, State, Local not specified - PPO | Admitting: Physician Assistant

## 2021-09-10 ENCOUNTER — Other Ambulatory Visit: Payer: Self-pay

## 2021-09-10 ENCOUNTER — Encounter: Payer: Self-pay | Admitting: Physician Assistant

## 2021-09-10 DIAGNOSIS — F331 Major depressive disorder, recurrent, moderate: Secondary | ICD-10-CM

## 2021-09-10 MED ORDER — FLUOXETINE HCL 40 MG PO CAPS: 40.0000 mg | ORAL_CAPSULE | Freq: Every day | ORAL | 1 refills | Status: DC

## 2021-09-10 NOTE — Progress Notes (Signed)
[facility]  Patient ID: Monique Hamilton,  MRN: [id number]  PCP: Kathyrn Lass, MD  Date of Evaluation: 09/10/2021 Time spent:20 minutes  Chief Complaint:  Chief Complaint   Depression; Follow-up     HISTORY/CURRENT STATUS: HPI For routine 1 month med check.  We restarted the Prozac last month.  States she is feeling somewhat better already.  Not perfect but better.  She is able to enjoy things.  Energy and motivation are better.  She is seeing Evelena Peat Deussing in counseling which is a good fit she believes.  She has seen her twice.  She is not isolating.  Not crying easily.  No suicidal or homicidal thoughts.  Does get more sad in the evenings because it is getting dark earlier.  Has a little anxiety, feels overwhelmed with work and just life in general but still better since starting the Prozac.  She is sleeping well.    Patient denies increased energy with decreased need for sleep, no increased talkativeness, no racing thoughts, no impulsivity or risky behaviors, no increased spending, no increased libido, no grandiosity, no increased irritability or anger, and no hallucinations.  Denies dizziness, syncope, seizures, numbness, tingling, tremor, tics, unsteady gait, slurred speech, confusion. Denies muscle or joint pain, stiffness, or dystonia.  Individual Medical History/ Review of Systems: Changes? :No    Past medications for mental health diagnoses include: Prozac  Allergies: Lemon flavor   Current Medications:  Current Outpatient Medications:    acetaminophen (TYLENOL) 650 MG CR tablet, Take 650 mg by mouth every 8 (eight) hours as needed for pain., Disp: , Rfl:    Apoaequorin (PREVAGEN PO), Take by mouth., Disp: , Rfl:    ibuprofen (ADVIL,MOTRIN) 200 MG tablet, Take 600 mg by mouth every 6 (six) hours as needed for pain. , Disp: , Rfl:    levonorgestrel (MIRENA) 20 MCG/24HR IUD, 1 each by Intrauterine route once., Disp: , Rfl:    MYRBETRIQ 25 MG TB24  tablet, Take 25 mg by mouth daily., Disp: , Rfl:    cholecalciferol (VITAMIN D) 1000 units tablet, Take 1,000 Units by mouth daily. (Patient not taking: Reported on 08/01/2021), Disp: , Rfl:    estradiol (CLIMARA) 0.06 MG/24HR, Place onto the skin., Disp: , Rfl:    ferrous sulfate 325 (65 FE) MG tablet, Take 325 mg by mouth daily with breakfast. (Patient not taking: Reported on 08/01/2021), Disp: , Rfl:    FLUoxetine (PROZAC) 40 MG capsule, Take 1 capsule (40 mg total) by mouth daily., Disp: 90 capsule, Rfl: 1   loratadine (CLARITIN) 10 MG tablet, Take 10 mg by mouth daily. (Patient not taking: Reported on 08/01/2021), Disp: , Rfl:    Multiple Vitamin (MULTI VITAMIN DAILY PO), Take 1 tablet by mouth daily. (Patient not taking: Reported on 08/01/2021), Disp: , Rfl:  Medication Side Effects: none  Family Medical/ Social History: Changes?  Dad died in 24-Oct-2020 from [place]:  There were no vitals taken for this visit.There is no height or weight on file to calculate BMI.  General Appearance: Casual, Neat and Well Groomed  Eye Contact:  Good  Speech:  Clear and Coherent  Volume:  Normal  Mood:  Euthymic  Affect:  Congruent  Thought Process:  Goal Directed  Orientation:  Full (Time, Place, and Person)  Thought Content: Logical   Suicidal Thoughts:  No  Homicidal Thoughts:  No  Memory:  WNL  Judgement:  Good  Insight:  Good  Psychomotor Activity:  Normal  Concentration:  Concentration: Good  Recall:  Good  Fund of Knowledge: Good  Language: Good  Assets:  Desire for Improvement  ADL's:  Intact  Cognition: WNL  Prognosis:  Good    DIAGNOSES:    ICD-10-CM   1. Major depressive disorder, recurrent episode, moderate ([place])  F33.1        Receiving Psychotherapy: Yes   [name]   RECOMMENDATIONS: PDMP was reviewed. No results available.  I provided 20 minutes of face to face time during this encounter, including time spent before and after the  visit in records review, medical decision making, counseling pertinent to today's visit, and charting.  I am glad to see her doing better!  No changes are necessary at this time. Continue Prozac 40 mg, 1 p.o. daily. Continue therapy with Carolin [facility]. Return in 6 months.  Donnal Moat, PA-C

## 2021-09-18 DIAGNOSIS — F320 Major depressive disorder, single episode, mild: Secondary | ICD-10-CM | POA: Diagnosis not present

## 2021-09-23 DIAGNOSIS — N3941 Urge incontinence: Secondary | ICD-10-CM | POA: Diagnosis not present

## 2021-09-23 DIAGNOSIS — R3915 Urgency of urination: Secondary | ICD-10-CM | POA: Diagnosis not present

## 2021-10-01 DIAGNOSIS — F320 Major depressive disorder, single episode, mild: Secondary | ICD-10-CM | POA: Diagnosis not present

## 2021-10-23 DIAGNOSIS — F320 Major depressive disorder, single episode, mild: Secondary | ICD-10-CM | POA: Diagnosis not present

## 2021-11-06 DIAGNOSIS — F320 Major depressive disorder, single episode, mild: Secondary | ICD-10-CM | POA: Diagnosis not present

## 2021-11-28 DIAGNOSIS — M545 Low back pain, unspecified: Secondary | ICD-10-CM | POA: Diagnosis not present

## 2021-12-31 DIAGNOSIS — F320 Major depressive disorder, single episode, mild: Secondary | ICD-10-CM | POA: Diagnosis not present

## 2022-01-20 DIAGNOSIS — F320 Major depressive disorder, single episode, mild: Secondary | ICD-10-CM | POA: Diagnosis not present

## 2022-03-10 ENCOUNTER — Ambulatory Visit: Payer: Federal, State, Local not specified - PPO | Admitting: Physician Assistant

## 2022-03-10 ENCOUNTER — Encounter: Payer: Self-pay | Admitting: Physician Assistant

## 2022-03-10 DIAGNOSIS — F3342 Major depressive disorder, recurrent, in full remission: Secondary | ICD-10-CM

## 2022-03-10 DIAGNOSIS — F19981 Other psychoactive substance use, unspecified with psychoactive substance-induced sexual dysfunction: Secondary | ICD-10-CM

## 2022-03-10 MED ORDER — BUPROPION HCL ER (XL) 150 MG PO TB24: 150.0000 mg | ORAL_TABLET | Freq: Every day | ORAL | 1 refills | Status: DC

## 2022-03-10 NOTE — Progress Notes (Signed)
[facility]  Patient ID: Monique Hamilton,  MRN: [id number]  PCP: Kathyrn Lass, MD  Date of Evaluation: 03/10/2022 Time spent:20 minutes  Chief Complaint:  Chief Complaint   Depression; Follow-up    HISTORY/CURRENT STATUS: HPI For routine med check.  Doing well as far as her mood goes.  This time of year is better anyway because there is more daylight and she is able to get outside more.  But she feels like the Prozac is helping.  She is able to enjoy things.  Energy and motivation are good.  ADLs and personal hygiene are normal.  Appetite is normal and weight is stable.  She is not isolating.  Work is going well.  She sleeps well most of the time.  She does cry easily sometimes, not triggered, "like if a certain memory comes to mind or something like that" but it is not very often and she has more good mood days than not.  No anxiety reported.  No suicidal or homicidal thoughts.  The only problem she is having is sexual side effects from the Prozac.  Has decreased libido and wonders if there is anything that can negate that.  Patient denies increased energy with decreased need for sleep, no increased talkativeness, no racing thoughts, no impulsivity or risky behaviors, no increased spending, no increased libido, no grandiosity, no increased irritability or anger, no paranoia, and no hallucinations.  Denies dizziness, syncope, seizures, numbness, tingling, tremor, tics, unsteady gait, slurred speech, confusion. Denies muscle or joint pain, stiffness, or dystonia.  Individual Medical History/ Review of Systems: Changes? :No    Past medications for mental health diagnoses include: Prozac, Effexor for post-partum  Allergies: Lemon flavor   Current Medications:  Current Outpatient Medications:    acetaminophen (TYLENOL) 650 MG CR tablet, Take 650 mg by mouth every 8 (eight) hours as needed for pain., Disp: , Rfl:    Apoaequorin (PREVAGEN PO), Take by mouth., Disp: ,  Rfl:    buPROPion (WELLBUTRIN XL) 150 MG 24 hr tablet, Take 1 tablet (150 mg total) by mouth daily., Disp: 30 tablet, Rfl: 1   FLUoxetine (PROZAC) 40 MG capsule, Take 1 capsule (40 mg total) by mouth daily., Disp: 90 capsule, Rfl: 1   ibuprofen (ADVIL,MOTRIN) 200 MG tablet, Take 600 mg by mouth every 6 (six) hours as needed for pain. , Disp: , Rfl:    levonorgestrel (MIRENA) 20 MCG/24HR IUD, 1 each by Intrauterine route once., Disp: , Rfl:    MYRBETRIQ 25 MG TB24 tablet, Take 25 mg by mouth daily., Disp: , Rfl:    cholecalciferol (VITAMIN D) 1000 units tablet, Take 1,000 Units by mouth daily. (Patient not taking: Reported on 08/01/2021), Disp: , Rfl:    estradiol (CLIMARA) 0.06 MG/24HR, Place onto the skin., Disp: , Rfl:    ferrous sulfate 325 (65 FE) MG tablet, Take 325 mg by mouth daily with breakfast. (Patient not taking: Reported on 08/01/2021), Disp: , Rfl:    loratadine (CLARITIN) 10 MG tablet, Take 10 mg by mouth daily. (Patient not taking: Reported on 08/01/2021), Disp: , Rfl:  Medication Side Effects: none  Family Medical/ Social History: Changes? No  MENTAL HEALTH EXAM:  There were no vitals taken for this visit.There is no height or weight on file to calculate BMI.  General Appearance: Casual, Neat and Well Groomed  Eye Contact:  Good  Speech:  Clear and Coherent and Normal Rate  Volume:  Normal  Mood:  Euthymic  Affect:  Congruent  Thought  Process:  Goal Directed and Descriptions of Associations: Circumstantial  Orientation:  Full (Time, Place, and Person)  Thought Content: Logical   Suicidal Thoughts:  No  Homicidal Thoughts:  No  Memory:  WNL  Judgement:  Good  Insight:  Good  Psychomotor Activity:  Normal  Concentration:  Concentration: Good  Recall:  Good  Fund of Knowledge: Good  Language: Good  Assets:  Desire for Improvement  ADL's:  Intact  Cognition: WNL  Prognosis:  Good    DIAGNOSES:    ICD-10-CM   1. Recurrent major depressive disorder, in full  remission ([place])  F33.42     2. 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 No results available.  I provided 20 minutes of face to face time during this encounter, including time spent before and after the visit in records review, medical decision making, counseling pertinent to today's visit, and charting.  We discussed the decreased libido and recommendation of Wellbutrin.  She would like to try it but if possible only wants to be on 1 medication for the depression.  She has never had a seizure or eating disorder.  Benefits risks and side effects of Wellbutrin were discussed and she accepts.  If the Wellbutrin is not tolerated or is ineffective for her mood, consider Trintellix which has less sexual side effects than other SSRIs.  Start Wellbutrin XL 150 mg, 1 p.o. every morning. Discontinue Prozac.  Explained that this weans itself over a couple of weeks time. Recommend she get back on multivitamin, vitamin D, B complex. Continue [facility] with [facility] Return in 4 weeks.  Donnal Moat, PA-C

## 2022-03-19 DIAGNOSIS — Z0000 Encounter for general adult medical examination without abnormal findings: Secondary | ICD-10-CM | POA: Diagnosis not present

## 2022-03-19 DIAGNOSIS — Z79899 Other long term (current) drug therapy: Secondary | ICD-10-CM | POA: Diagnosis not present

## 2022-03-19 DIAGNOSIS — Z1159 Encounter for screening for other viral diseases: Secondary | ICD-10-CM | POA: Diagnosis not present

## 2022-03-19 DIAGNOSIS — E559 Vitamin D deficiency, unspecified: Secondary | ICD-10-CM | POA: Diagnosis not present

## 2022-03-19 DIAGNOSIS — Z13220 Encounter for screening for lipoid disorders: Secondary | ICD-10-CM | POA: Diagnosis not present

## 2022-04-10 ENCOUNTER — Ambulatory Visit: Payer: Federal, State, Local not specified - PPO | Admitting: Physician Assistant

## 2022-04-10 ENCOUNTER — Encounter: Payer: Self-pay | Admitting: Physician Assistant

## 2022-04-10 DIAGNOSIS — F3342 Major depressive disorder, recurrent, in full remission: Secondary | ICD-10-CM | POA: Diagnosis not present

## 2022-04-10 MED ORDER — BUPROPION HCL ER (XL) 150 MG PO TB24: 150.0000 mg | ORAL_TABLET | Freq: Every day | ORAL | 0 refills | Status: DC

## 2022-04-10 NOTE — Progress Notes (Signed)
[facility]  Patient ID: Monique Hamilton,  MRN: [id number]  PCP: Kathyrn Lass, MD  Date of Evaluation: 04/10/2022 Time spent:20 minutes  Chief Complaint:  Chief Complaint   Depression; Follow-up    HISTORY/CURRENT STATUS: HPI For routine med check.  A month ago we stopped Prozac due to sexual side effects and started Wellbutrin.  She is no longer experiencing those side effects.  Is having no side effects at all with the Wellbutrin.  Her mood is good, not having major ups or downs.  She is able to enjoy things, loves to read and is involved in a book club.  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  She sleeps well.  Work is going well.  Appetite and weight are stable.  ADLs and personal hygiene are normal.  No suicidal or homicidal thoughts.  Patient denies increased energy with decreased need for sleep, no increased talkativeness, no racing thoughts, no impulsivity or risky behaviors, no increased spending, no increased libido, no grandiosity, no increased irritability or anger, no paranoia, and no hallucinations.  Denies dizziness, syncope, seizures, numbness, tingling, tremor, tics, unsteady gait, slurred speech, confusion. Denies muscle or joint pain, stiffness, or dystonia.  Individual Medical History/ Review of Systems: Changes? :No    Past medications for mental health diagnoses include: Prozac caused sexual side affects, Effexor for post-partum, Wellbutrin  Allergies: Lemon flavor   Current Medications:  Current Outpatient Medications:    acetaminophen (TYLENOL) 650 MG CR tablet, Take 650 mg by mouth every 8 (eight) hours as needed for pain., Disp: , Rfl:    Apoaequorin (PREVAGEN PO), Take by mouth., Disp: , Rfl:    ibuprofen (ADVIL,MOTRIN) 200 MG tablet, Take 600 mg by mouth every 6 (six) hours as needed for pain. , Disp: , Rfl:    levonorgestrel (MIRENA) 20 MCG/24HR IUD, 1 each by Intrauterine route once., Disp: , Rfl:     MYRBETRIQ 25 MG TB24 tablet, Take 25 mg by mouth daily., Disp: , Rfl:    buPROPion (WELLBUTRIN XL) 150 MG 24 hr tablet, Take 1 tablet (150 mg total) by mouth daily., Disp: 90 tablet, Rfl: 0   cholecalciferol (VITAMIN D) 1000 units tablet, Take 1,000 Units by mouth daily. (Patient not taking: Reported on 08/01/2021), Disp: , Rfl:    estradiol (CLIMARA) 0.06 MG/24HR, Place onto the skin., Disp: , Rfl:  Medication Side Effects: none  Family Medical/ Social History: Changes? No  MENTAL HEALTH EXAM:  There were no vitals taken for this visit.There is no height or weight on file to calculate BMI.  General Appearance: Casual, Neat and Well Groomed  Eye Contact:  Good  Speech:  Clear and Coherent and Normal Rate  Volume:  Normal  Mood:  Euthymic  Affect:  Congruent  Thought Process:  Goal Directed and Descriptions of Associations: Circumstantial  Orientation:  Full (Time, Place, and Person)  Thought Content: Logical   Suicidal Thoughts:  No  Homicidal Thoughts:  No  Memory:  WNL  Judgement:  Good  Insight:  Good  Psychomotor Activity:  Normal  Concentration:  Concentration: Good and Attention Span: Good  Recall:  Good  Fund of Knowledge: Good  Language: Good  Assets:  Desire for Improvement  ADL's:  Intact  Cognition: WNL  Prognosis:  Good    DIAGNOSES:    ICD-10-CM   1. Recurrent major depressive disorder, in full remission ([place])  F33.42        Receiving Psychotherapy: No  Evelena Peat Deussing in the past.  RECOMMENDATIONS: PDMP was reviewed. No results available.  I provided 20 minutes of face to face time during this encounter, including time spent before and after the visit in records review, medical decision making, counseling pertinent to today's visit, and charting.  I am glad she is doing well.  No changes need to be made.  Continue Wellbutrin XL 150 mg, 1 p.o. every morning. Recommend she get back on multivitamin, vitamin D, B complex. Return to therapy as  needed. Return in 3 months.   Donnal Moat, PA-C

## 2022-05-09 ENCOUNTER — Other Ambulatory Visit: Payer: Self-pay | Admitting: Physician Assistant

## 2022-05-29 ENCOUNTER — Other Ambulatory Visit: Payer: Self-pay | Admitting: Family Medicine

## 2022-05-29 DIAGNOSIS — Z1231 Encounter for screening mammogram for malignant neoplasm of breast: Secondary | ICD-10-CM

## 2022-06-13 DIAGNOSIS — F320 Major depressive disorder, single episode, mild: Secondary | ICD-10-CM | POA: Diagnosis not present

## 2022-07-02 ENCOUNTER — Ambulatory Visit: Payer: Federal, State, Local not specified - PPO

## 2022-07-15 ENCOUNTER — Ambulatory Visit: Payer: Federal, State, Local not specified - PPO | Admitting: Physician Assistant

## 2022-07-25 ENCOUNTER — Ambulatory Visit: Payer: Federal, State, Local not specified - PPO | Admitting: Physician Assistant

## 2022-07-25 ENCOUNTER — Encounter: Payer: Self-pay | Admitting: Physician Assistant

## 2022-07-25 DIAGNOSIS — R232 Flushing: Secondary | ICD-10-CM

## 2022-07-25 DIAGNOSIS — F3342 Major depressive disorder, recurrent, in full remission: Secondary | ICD-10-CM

## 2022-07-25 MED ORDER — BUPROPION HCL ER (XL) 150 MG PO TB24: 150.0000 mg | ORAL_TABLET | Freq: Every day | ORAL | 1 refills | Status: DC

## 2022-07-25 NOTE — Progress Notes (Signed)
[facility]  Patient ID: Monique Hamilton,  MRN: [id number]  PCP: Kathyrn Lass, MD  Date of Evaluation: 07/25/2022 Time spent:20 minutes  Chief Complaint:  Chief Complaint   Depression; Follow-up    HISTORY/CURRENT STATUS: HPI For routine med check.  Patient is able to enjoy things.  Energy and motivation are good.  Work is going well.  She is also active in local theater, and is in a book club which she enjoys very much.  No extreme sadness, tearfulness, or feelings of hopelessness.  Sleeps well most of the time. ADLs and personal hygiene are normal.   Denies any changes in concentration, making decisions, or remembering things.  Appetite has not changed.  Weight is stable.  Not isolating.  Not crying easily.  No complaints of anxiety.  Denies suicidal or homicidal thoughts.  Patient denies increased energy with decreased need for sleep, increased talkativeness, racing thoughts, impulsivity or risky behaviors, increased spending, increased libido, grandiosity, increased irritability or anger, paranoia, or hallucinations.  Denies dizziness, syncope, seizures, numbness, tingling, tremor, tics, unsteady gait, slurred speech, confusion. Denies muscle or joint pain, stiffness, or dystonia.  Individual Medical History/ Review of Systems: Changes? :No    Past medications for mental health diagnoses include: Prozac caused sexual side affects, Effexor for post-partum, Wellbutrin  Allergies: Lemon flavor   Current Medications:  Current Outpatient Medications:    acetaminophen (TYLENOL) 650 MG CR tablet, Take 650 mg by mouth every 8 (eight) hours as needed for pain., Disp: , Rfl:    Apoaequorin (PREVAGEN PO), Take by mouth., Disp: , Rfl:    cholecalciferol (VITAMIN D) 1000 units tablet, Take 1,000 Units by mouth daily., Disp: , Rfl:    ibuprofen (ADVIL,MOTRIN) 200 MG tablet, Take 600 mg by mouth every 6 (six) hours as needed for pain. , Disp: , Rfl:    levonorgestrel  (MIRENA) 20 MCG/24HR IUD, 1 each by Intrauterine route once., Disp: , Rfl:    MYRBETRIQ 25 MG TB24 tablet, Take 25 mg by mouth daily., Disp: , Rfl:    buPROPion (WELLBUTRIN XL) 150 MG 24 hr tablet, Take 1 tablet (150 mg total) by mouth daily., Disp: 90 tablet, Rfl: 1   estradiol (CLIMARA) 0.06 MG/24HR, Place onto the skin., Disp: , Rfl:  Medication Side Effects:  hot flashes may be from Wellbutrin.  They are not bad enough that she wants to pursue any treatment.  Family Medical/ Social History: Changes? No  MENTAL HEALTH EXAM:  There were no vitals taken for this visit.There is no height or weight on file to calculate BMI.  General Appearance: Casual, Neat and Well Groomed  Eye Contact:  Good  Speech:  Clear and Coherent and Normal Rate  Volume:  Normal  Mood:  Euthymic  Affect:  Congruent  Thought Process:  Goal Directed and Descriptions of Associations: Circumstantial  Orientation:  Full (Time, Place, and Person)  Thought Content: Logical   Suicidal Thoughts:  No  Homicidal Thoughts:  No  Memory:  WNL  Judgement:  Good  Insight:  Good  Psychomotor Activity:  Normal  Concentration:  Concentration: Good and Attention Span: Good  Recall:  Good  Fund of Knowledge: Good  Language: Good  Assets:  Desire for Improvement Financial Resources/Insurance Housing Transportation Vocational/Educational  ADL's:  Intact  Cognition: WNL  Prognosis:  Good   DIAGNOSES:    ICD-10-CM   1. Recurrent major depressive disorder, in full remission ([place])  F33.42     2. Hot flashes  R23.2  Receiving Psychotherapy: No   Evelena Peat Deussing in the past.  RECOMMENDATIONS: PDMP was reviewed. No results available.  I provided 20 minutes of face to face time during this encounter, including time spent before and after the visit in records review, medical decision making, counseling pertinent to today's visit, and charting.   She is doing well so no changes need to be made. Continue light  therapy.   Recommend multivitamin, B complex, vitamin D, and fish oil daily. Continue Wellbutrin XL 150 mg, 1 p.o. every morning. Return in 6 months.   Donnal Moat, PA-C

## 2022-07-28 ENCOUNTER — Ambulatory Visit
Admission: RE | Admit: 2022-07-28 | Discharge: 2022-07-28 | Disposition: A | Discharge status: Home / Self Care | Payer: Federal, State, Local not specified - PPO | Source: Ambulatory Visit | Attending: Family Medicine | Admitting: Family Medicine

## 2022-07-28 DIAGNOSIS — Z1231 Encounter for screening mammogram for malignant neoplasm of breast: Secondary | ICD-10-CM

## 2022-09-15 DIAGNOSIS — Z30432 Encounter for removal of intrauterine contraceptive device: Secondary | ICD-10-CM | POA: Diagnosis not present

## 2022-09-15 DIAGNOSIS — Z01419 Encounter for gynecological examination (general) (routine) without abnormal findings: Secondary | ICD-10-CM | POA: Diagnosis not present

## 2022-12-16 DIAGNOSIS — M5137 Other intervertebral disc degeneration, lumbosacral region: Secondary | ICD-10-CM | POA: Diagnosis not present

## 2022-12-16 DIAGNOSIS — M9905 Segmental and somatic dysfunction of pelvic region: Secondary | ICD-10-CM | POA: Diagnosis not present

## 2022-12-16 DIAGNOSIS — M25552 Pain in left hip: Secondary | ICD-10-CM | POA: Diagnosis not present

## 2022-12-16 DIAGNOSIS — M9903 Segmental and somatic dysfunction of lumbar region: Secondary | ICD-10-CM | POA: Diagnosis not present

## 2022-12-22 DIAGNOSIS — M5137 Other intervertebral disc degeneration, lumbosacral region: Secondary | ICD-10-CM | POA: Diagnosis not present

## 2022-12-22 DIAGNOSIS — M25552 Pain in left hip: Secondary | ICD-10-CM | POA: Diagnosis not present

## 2022-12-22 DIAGNOSIS — M9905 Segmental and somatic dysfunction of pelvic region: Secondary | ICD-10-CM | POA: Diagnosis not present

## 2022-12-22 DIAGNOSIS — M9903 Segmental and somatic dysfunction of lumbar region: Secondary | ICD-10-CM | POA: Diagnosis not present

## 2022-12-23 DIAGNOSIS — M9905 Segmental and somatic dysfunction of pelvic region: Secondary | ICD-10-CM | POA: Diagnosis not present

## 2022-12-23 DIAGNOSIS — M25552 Pain in left hip: Secondary | ICD-10-CM | POA: Diagnosis not present

## 2022-12-23 DIAGNOSIS — M5137 Other intervertebral disc degeneration, lumbosacral region: Secondary | ICD-10-CM | POA: Diagnosis not present

## 2022-12-23 DIAGNOSIS — M9903 Segmental and somatic dysfunction of lumbar region: Secondary | ICD-10-CM | POA: Diagnosis not present

## 2022-12-25 DIAGNOSIS — M25552 Pain in left hip: Secondary | ICD-10-CM | POA: Diagnosis not present

## 2022-12-25 DIAGNOSIS — M9903 Segmental and somatic dysfunction of lumbar region: Secondary | ICD-10-CM | POA: Diagnosis not present

## 2022-12-25 DIAGNOSIS — M5137 Other intervertebral disc degeneration, lumbosacral region: Secondary | ICD-10-CM | POA: Diagnosis not present

## 2022-12-25 DIAGNOSIS — M9905 Segmental and somatic dysfunction of pelvic region: Secondary | ICD-10-CM | POA: Diagnosis not present

## 2022-12-29 DIAGNOSIS — M9905 Segmental and somatic dysfunction of pelvic region: Secondary | ICD-10-CM | POA: Diagnosis not present

## 2022-12-29 DIAGNOSIS — M25552 Pain in left hip: Secondary | ICD-10-CM | POA: Diagnosis not present

## 2022-12-29 DIAGNOSIS — M9903 Segmental and somatic dysfunction of lumbar region: Secondary | ICD-10-CM | POA: Diagnosis not present

## 2022-12-29 DIAGNOSIS — M5137 Other intervertebral disc degeneration, lumbosacral region: Secondary | ICD-10-CM | POA: Diagnosis not present

## 2022-12-31 DIAGNOSIS — M25552 Pain in left hip: Secondary | ICD-10-CM | POA: Diagnosis not present

## 2022-12-31 DIAGNOSIS — M9905 Segmental and somatic dysfunction of pelvic region: Secondary | ICD-10-CM | POA: Diagnosis not present

## 2022-12-31 DIAGNOSIS — M9903 Segmental and somatic dysfunction of lumbar region: Secondary | ICD-10-CM | POA: Diagnosis not present

## 2022-12-31 DIAGNOSIS — M5137 Other intervertebral disc degeneration, lumbosacral region: Secondary | ICD-10-CM | POA: Diagnosis not present

## 2023-01-06 DIAGNOSIS — M9905 Segmental and somatic dysfunction of pelvic region: Secondary | ICD-10-CM | POA: Diagnosis not present

## 2023-01-06 DIAGNOSIS — M25552 Pain in left hip: Secondary | ICD-10-CM | POA: Diagnosis not present

## 2023-01-06 DIAGNOSIS — M5137 Other intervertebral disc degeneration, lumbosacral region: Secondary | ICD-10-CM | POA: Diagnosis not present

## 2023-01-06 DIAGNOSIS — M9903 Segmental and somatic dysfunction of lumbar region: Secondary | ICD-10-CM | POA: Diagnosis not present

## 2023-01-07 DIAGNOSIS — R3915 Urgency of urination: Secondary | ICD-10-CM | POA: Diagnosis not present

## 2023-01-07 DIAGNOSIS — N3941 Urge incontinence: Secondary | ICD-10-CM | POA: Diagnosis not present

## 2023-01-08 DIAGNOSIS — M9905 Segmental and somatic dysfunction of pelvic region: Secondary | ICD-10-CM | POA: Diagnosis not present

## 2023-01-08 DIAGNOSIS — M5137 Other intervertebral disc degeneration, lumbosacral region: Secondary | ICD-10-CM | POA: Diagnosis not present

## 2023-01-08 DIAGNOSIS — M25552 Pain in left hip: Secondary | ICD-10-CM | POA: Diagnosis not present

## 2023-01-08 DIAGNOSIS — M9903 Segmental and somatic dysfunction of lumbar region: Secondary | ICD-10-CM | POA: Diagnosis not present

## 2023-01-14 DIAGNOSIS — M9905 Segmental and somatic dysfunction of pelvic region: Secondary | ICD-10-CM | POA: Diagnosis not present

## 2023-01-14 DIAGNOSIS — M25552 Pain in left hip: Secondary | ICD-10-CM | POA: Diagnosis not present

## 2023-01-14 DIAGNOSIS — M9903 Segmental and somatic dysfunction of lumbar region: Secondary | ICD-10-CM | POA: Diagnosis not present

## 2023-01-14 DIAGNOSIS — M5137 Other intervertebral disc degeneration, lumbosacral region: Secondary | ICD-10-CM | POA: Diagnosis not present

## 2023-01-20 DIAGNOSIS — M9905 Segmental and somatic dysfunction of pelvic region: Secondary | ICD-10-CM | POA: Diagnosis not present

## 2023-01-20 DIAGNOSIS — M9903 Segmental and somatic dysfunction of lumbar region: Secondary | ICD-10-CM | POA: Diagnosis not present

## 2023-01-20 DIAGNOSIS — M25552 Pain in left hip: Secondary | ICD-10-CM | POA: Diagnosis not present

## 2023-01-20 DIAGNOSIS — M5137 Other intervertebral disc degeneration, lumbosacral region: Secondary | ICD-10-CM | POA: Diagnosis not present

## 2023-01-22 DIAGNOSIS — M5137 Other intervertebral disc degeneration, lumbosacral region: Secondary | ICD-10-CM | POA: Diagnosis not present

## 2023-01-22 DIAGNOSIS — M9903 Segmental and somatic dysfunction of lumbar region: Secondary | ICD-10-CM | POA: Diagnosis not present

## 2023-01-22 DIAGNOSIS — M25552 Pain in left hip: Secondary | ICD-10-CM | POA: Diagnosis not present

## 2023-01-22 DIAGNOSIS — M9905 Segmental and somatic dysfunction of pelvic region: Secondary | ICD-10-CM | POA: Diagnosis not present

## 2023-01-23 DIAGNOSIS — F320 Major depressive disorder, single episode, mild: Secondary | ICD-10-CM | POA: Diagnosis not present

## 2023-01-26 ENCOUNTER — Encounter: Payer: Self-pay | Admitting: Physician Assistant

## 2023-01-26 ENCOUNTER — Ambulatory Visit: Payer: Federal, State, Local not specified - PPO | Admitting: Physician Assistant

## 2023-01-26 DIAGNOSIS — F331 Major depressive disorder, recurrent, moderate: Secondary | ICD-10-CM

## 2023-01-26 DIAGNOSIS — Z630 Problems in relationship with spouse or partner: Secondary | ICD-10-CM

## 2023-01-26 MED ORDER — BUPROPION HCL ER (XL) 300 MG PO TB24: 300.0000 mg | ORAL_TABLET | Freq: Every day | ORAL | 1 refills | Status: DC

## 2023-01-26 NOTE — Progress Notes (Signed)
[facility]  Patient ID: Monique Hamilton,  MRN: [id number]  PCP: Sigmund Hazel, MD  Date of Evaluation: 01/26/2023 Time spent:20 minutes  Chief Complaint:  Chief Complaint   Depression; Follow-up    HISTORY/CURRENT STATUS: HPI For routine med check.  Under a lot of stress at home. She and her husband will be starting couples counseling this week.  She gets overwhelmed with things.  Not having panic attacks though.  States she is not enjoying things as much as she used to.  Pushes herself to do things.  She is still involved in theater and book club.  Has decreased libido and difficulty having orgasm, we thought this was related to Prozac in the past but now she is not sure.  Energy and motivation are fair.  Work is going well.  She is sad a lot and cries more often than is warranted.  Sleeps well most of the time. ADLs and personal hygiene are normal.   Denies any changes in concentration, making decisions, or remembering things.  Appetite has not changed.  Weight is stable.  Denies suicidal or homicidal thoughts.  Patient denies increased energy with decreased need for sleep, increased talkativeness, racing thoughts, impulsivity or risky behaviors, increased spending, increased libido, grandiosity, increased irritability or anger, paranoia, or hallucinations.  Denies dizziness, syncope, seizures, numbness, tingling, tremor, tics, unsteady gait, slurred speech, confusion. Denies muscle or joint pain, stiffness, or dystonia.  Individual Medical History/ Review of Systems: Changes? :No    Past medications for mental health diagnoses include: Prozac caused sexual side affects, Effexor for post-partum, Wellbutrin  Allergies: Lemon flavor   Current Medications:  Current Outpatient Medications:    acetaminophen (TYLENOL) 650 MG CR tablet, Take 650 mg by mouth every 8 (eight) hours as needed for pain., Disp: , Rfl:    buPROPion (WELLBUTRIN XL) 300 MG 24 hr tablet, Take 1  tablet (300 mg total) by mouth daily., Disp: 30 tablet, Rfl: 1   cholecalciferol (VITAMIN D) 1000 units tablet, Take 1,000 Units by mouth daily., Disp: , Rfl:    ibuprofen (ADVIL,MOTRIN) 200 MG tablet, Take 600 mg by mouth every 6 (six) hours as needed for pain. , Disp: , Rfl:    MYRBETRIQ 25 MG TB24 tablet, Take 25 mg by mouth daily., Disp: , Rfl:    estradiol (CLIMARA) 0.06 MG/24HR, Place onto the skin., Disp: , Rfl:    levonorgestrel (MIRENA) 20 MCG/24HR IUD, 1 each by Intrauterine route once. (Patient not taking: Reported on 01/26/2023), Disp: , Rfl:  Medication Side Effects: none  Family Medical/ Social History: Changes? No  MENTAL HEALTH EXAM:  There were no vitals taken for this visit.There is no height or weight on file to calculate BMI.  General Appearance: Casual, Neat and Well Groomed  Eye Contact:  Good  Speech:  Clear and Coherent and Normal Rate  Volume:  Normal  Mood:   sad  Affect:  Congruent  Thought Process:  Goal Directed and Descriptions of Associations: Circumstantial  Orientation:  Full (Time, Place, and Person)  Thought Content: Logical   Suicidal Thoughts:  No  Homicidal Thoughts:  No  Memory:  WNL  Judgement:  Good  Insight:  Good  Psychomotor Activity:  Normal  Concentration:  Concentration: Good and Attention Span: Good  Recall:  Good  Fund of Knowledge: Good  Language: Good  Assets:  Desire for Improvement Financial Resources/Insurance Housing Transportation Vocational/Educational  ADL's:  Intact  Cognition: WNL  Prognosis:  Good   DIAGNOSES:  ICD-10-CM   1. Major depressive disorder, recurrent episode, moderate  F33.1     2. Marital stress  Z63.0       Receiving Psychotherapy: Yes   Kelton Pillar, we will start couples counseling this week with her.  RECOMMENDATIONS: PDMP was reviewed. No controlled substances.   I provided 20 minutes of face to face time during this encounter, including time spent before and after the visit in  records review, medical decision making, counseling pertinent to today's visit, and charting.   We discussed her symptoms.  I recommend increasing the Wellbutrin.  A lot of what she is dealing with is circumstantial, she agrees.  I do think increasing the Wellbutrin would be helpful, would give her more energy and motivation and keep her from crying so much.  We may need to go back on Prozac or use another medication, possibly Trintellix which does not usually have as many sexual side effects, but I think increasing Wellbutrin first is the best choice.  Increase Wellbutrin XL to 300 mg, 1 p.o. daily. Continue multivitamin, B complex, vitamin D, and fish oil daily. Continue therapy with [name]. Return in 4 to 6 weeks.  [name], PA-C

## 2023-01-27 DIAGNOSIS — M9905 Segmental and somatic dysfunction of pelvic region: Secondary | ICD-10-CM | POA: Diagnosis not present

## 2023-01-27 DIAGNOSIS — M9903 Segmental and somatic dysfunction of lumbar region: Secondary | ICD-10-CM | POA: Diagnosis not present

## 2023-01-27 DIAGNOSIS — M5137 Other intervertebral disc degeneration, lumbosacral region: Secondary | ICD-10-CM | POA: Diagnosis not present

## 2023-01-27 DIAGNOSIS — M25552 Pain in left hip: Secondary | ICD-10-CM | POA: Diagnosis not present

## 2023-01-28 DIAGNOSIS — F320 Major depressive disorder, single episode, mild: Secondary | ICD-10-CM | POA: Diagnosis not present

## 2023-02-02 DIAGNOSIS — M25561 Pain in right knee: Secondary | ICD-10-CM | POA: Diagnosis not present

## 2023-02-23 ENCOUNTER — Encounter: Payer: Self-pay | Admitting: Physician Assistant

## 2023-02-23 ENCOUNTER — Ambulatory Visit: Payer: Federal, State, Local not specified - PPO | Admitting: Physician Assistant

## 2023-02-23 DIAGNOSIS — F19981 Other psychoactive substance use, unspecified with psychoactive substance-induced sexual dysfunction: Secondary | ICD-10-CM

## 2023-02-23 DIAGNOSIS — Z630 Problems in relationship with spouse or partner: Secondary | ICD-10-CM | POA: Diagnosis not present

## 2023-02-23 DIAGNOSIS — F331 Major depressive disorder, recurrent, moderate: Secondary | ICD-10-CM | POA: Diagnosis not present

## 2023-02-23 MED ORDER — FLUOXETINE HCL 20 MG PO CAPS: 20.0000 mg | ORAL_CAPSULE | Freq: Every day | ORAL | 1 refills | Status: DC

## 2023-02-23 NOTE — Progress Notes (Signed)
[facility]  Patient ID: Monique Hamilton,  MRN: [id number]  PCP: Sigmund Hazel, MD  Date of Evaluation: 02/23/2023 Time spent:20 minutes  Chief Complaint:  Chief Complaint   Depression; Follow-up    HISTORY/CURRENT STATUS: HPI For routine med check.  We increased Wellbutrin at LOV. Still crying easily, energy and motivation are low. She doesn't stay in bed or miss work but if she had her choice she would.  She and her husband are still having problems, they have not started couples counseling.  She is wanting him to be the one to choose a counselor who he is comfortable with.  She is still seeing her counselor.  Does not enjoy much of anything.  Appetite is normal and weight is stable.  ADLs and personal hygiene are normal.  No anxiety unless there is a reason to be anxious.  Denies suicidal or homicidal thoughts.  Patient denies increased energy with decreased need for sleep, increased talkativeness, racing thoughts, impulsivity or risky behaviors, increased spending, increased libido, grandiosity, increased irritability or anger, paranoia, or hallucinations.  Denies dizziness, syncope, seizures, numbness, tingling, tremor, tics, unsteady gait, slurred speech, confusion. Denies muscle or joint pain, stiffness, or dystonia.  Individual Medical History/ Review of Systems: Changes? :No    Past medications for mental health diagnoses include: Prozac caused sexual side affects, Effexor for post-partum, Wellbutrin  Allergies: Lemon flavor   Current Medications:  Current Outpatient Medications:    acetaminophen (TYLENOL) 650 MG CR tablet, Take 650 mg by mouth every 8 (eight) hours as needed for pain., Disp: , Rfl:    buPROPion (WELLBUTRIN XL) 300 MG 24 hr tablet, Take 1 tablet (300 mg total) by mouth daily., Disp: 30 tablet, Rfl: 1   ibuprofen (ADVIL,MOTRIN) 200 MG tablet, Take 600 mg by mouth every 6 (six) hours as needed for pain. , Disp: , Rfl:    MYRBETRIQ 25 MG  TB24 tablet, Take 25 mg by mouth daily., Disp: , Rfl:    cholecalciferol (VITAMIN D) 1000 units tablet, Take 1,000 Units by mouth daily. (Patient not taking: Reported on 02/23/2023), Disp: , Rfl:    estradiol (CLIMARA) 0.06 MG/24HR, Place onto the skin., Disp: , Rfl:    FLUoxetine (PROZAC) 20 MG capsule, Take 1 capsule (20 mg total) by mouth daily., Disp: 90 capsule, Rfl: 1   levonorgestrel (MIRENA) 20 MCG/24HR IUD, 1 each by Intrauterine route once. (Patient not taking: Reported on 01/26/2023), Disp: , Rfl:  Medication Side Effects: none  Family Medical/ Social History: Changes? No  MENTAL HEALTH EXAM:  There were no vitals taken for this visit.There is no height or weight on file to calculate BMI.  General Appearance: Casual, Neat and Well Groomed  Eye Contact:  Good  Speech:  Clear and Coherent and Normal Rate  Volume:  Normal  Mood:  Depressed  Affect:  Congruent  Thought Process:  Goal Directed and Descriptions of Associations: Circumstantial  Orientation:  Full (Time, Place, and Person)  Thought Content: Logical   Suicidal Thoughts:  No  Homicidal Thoughts:  No  Memory:  WNL  Judgement:  Good  Insight:  Good  Psychomotor Activity:  Normal  Concentration:  Concentration: Good and Attention Span: Good  Recall:  Good  Fund of Knowledge: Good  Language: Good  Assets:  Desire for Improvement Financial Resources/Insurance Housing Transportation Vocational/Educational  ADL's:  Intact  Cognition: WNL  Prognosis:  Good   DIAGNOSES:    ICD-10-CM   1. Major depressive disorder, recurrent episode, moderate ([facility])  F33.1     2. 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 Marital stress  Z63.0      Receiving Psychotherapy: Yes   Kelton Pillar, we will start couples counseling this week with her.  RECOMMENDATIONS: PDMP was reviewed. No controlled substances.   I provided 20 minutes of face to face time during this encounter, including time spent  before and after the visit in records review, medical decision making, counseling pertinent to today's visit, and charting.   Discussed the treatment of either adding Prozac back in or starting Trintellix since it does not usually have as many sexual side effects.  We agreed to start the Prozac because we know it has been effective in the past.  The possible sexual side effects are not an issue right now but she will let me know if they do occur in the future. For now we will leave the Wellbutrin the same.  But once the Prozac kicks in we will consider decreasing the dose or even weaning off the Wellbutrin.  She does not want to be on 2 medications if at all possible.  Continue Wellbutrin XL 300 mg, 1 p.o. daily. Restart Prozac 20 mg, 1 p.o. daily. Continue multivitamin, B complex, vitamin D, and fish oil daily. Continue therapy with [name]. Return in 4 to 6 weeks.  [name], PA-C

## 2023-02-24 DIAGNOSIS — M1711 Unilateral primary osteoarthritis, right knee: Secondary | ICD-10-CM | POA: Diagnosis not present

## 2023-02-24 DIAGNOSIS — M7051 Other bursitis of knee, right knee: Secondary | ICD-10-CM | POA: Diagnosis not present

## 2023-03-23 DIAGNOSIS — Z0000 Encounter for general adult medical examination without abnormal findings: Secondary | ICD-10-CM | POA: Diagnosis not present

## 2023-03-29 ENCOUNTER — Other Ambulatory Visit: Payer: Self-pay | Admitting: Physician Assistant

## 2023-03-29 NOTE — Telephone Encounter (Signed)
Has appt. tomorrow

## 2023-03-30 ENCOUNTER — Encounter: Payer: Self-pay | Admitting: Physician Assistant

## 2023-03-30 ENCOUNTER — Ambulatory Visit: Payer: Federal, State, Local not specified - PPO | Admitting: Physician Assistant

## 2023-03-30 DIAGNOSIS — Z630 Problems in relationship with spouse or partner: Secondary | ICD-10-CM | POA: Diagnosis not present

## 2023-03-30 DIAGNOSIS — F3341 Major depressive disorder, recurrent, in partial remission: Secondary | ICD-10-CM | POA: Diagnosis not present

## 2023-03-30 MED ORDER — BUPROPION HCL ER (XL) 150 MG PO TB24: 150.0000 mg | ORAL_TABLET | Freq: Every day | ORAL | 1 refills | Status: DC

## 2023-03-30 NOTE — Progress Notes (Signed)
[facility]  Patient ID: Monique Hamilton,  MRN: [id number]  PCP: Sigmund Hazel, MD  Date of Evaluation: 03/30/2023 Time spent:20 minutes  Chief Complaint:  Chief Complaint   Depression; Follow-up    HISTORY/CURRENT STATUS: HPI For routine med check.  She's doing quite a bit better since restarting the Prozac a month ago.  She is not as weepy.  Has a little more energy and motivation although that never affected her work in a negative way.  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  She enjoys that.  She is also reading for pleasure.  She asks if going off the Wellbutrin is an option.  She really does not want to be on 2 medications for depression if possible.  Sleeps well most of the time. ADLs and personal hygiene are normal.   Denies any changes in concentration, making decisions, or remembering things.  Appetite has not changed.  Weight is stable.  Not having anxiety very much.  She and her husband are still having some problems.  He is not making any effort in initiating marriage counseling, so she is not pushing it.  Denies suicidal or homicidal thoughts.  Patient denies increased energy with decreased need for sleep, increased talkativeness, racing thoughts, impulsivity or risky behaviors, increased spending, increased libido, grandiosity, increased irritability or anger, paranoia, or hallucinations.  Denies dizziness, syncope, seizures, numbness, tingling, tremor, tics, unsteady gait, slurred speech, confusion. Denies muscle or joint pain, stiffness, or dystonia.  Individual Medical History/ Review of Systems: Changes? :No    Past medications for mental health diagnoses include: Prozac caused sexual side affects, Effexor for post-partum, Wellbutrin  Allergies: Lemon flavor   Current Medications:  Current Outpatient Medications:    acetaminophen (TYLENOL) 650 MG CR tablet, Take 650 mg by mouth every 8 (eight) hours as needed for pain.,  Disp: , Rfl:    Apoaequorin (PREVAGEN PO), Take by mouth., Disp: , Rfl:    buPROPion (WELLBUTRIN XL) 150 MG 24 hr tablet, Take 1 tablet (150 mg total) by mouth daily., Disp: 30 tablet, Rfl: 1   FLUoxetine (PROZAC) 20 MG capsule, Take 1 capsule (20 mg total) by mouth daily., Disp: 90 capsule, Rfl: 1   ibuprofen (ADVIL,MOTRIN) 200 MG tablet, Take 600 mg by mouth every 6 (six) hours as needed for pain. , Disp: , Rfl:    MYRBETRIQ 25 MG TB24 tablet, Take 25 mg by mouth daily., Disp: , Rfl:    cholecalciferol (VITAMIN D) 1000 units tablet, Take 1,000 Units by mouth daily. (Patient not taking: Reported on 02/23/2023), Disp: , Rfl:    estradiol (CLIMARA) 0.06 MG/24HR, Place onto the skin., Disp: , Rfl:  Medication Side Effects: none  Family Medical/ Social History: Changes? No  MENTAL HEALTH EXAM:  There were no vitals taken for this visit.There is no height or weight on file to calculate BMI.  General Appearance: Casual, Neat and Well Groomed  Eye Contact:  Good  Speech:  Clear and Coherent and Normal Rate  Volume:  Normal  Mood:  Euthymic  Affect:  Congruent  Thought Process:  Goal Directed and Descriptions of Associations: Circumstantial  Orientation:  Full (Time, Place, and Person)  Thought Content: Logical   Suicidal Thoughts:  No  Homicidal Thoughts:  No  Memory:  WNL  Judgement:  Good  Insight:  Good  Psychomotor Activity:  Normal  Concentration:  Concentration: Good and Attention Span: Good  Recall:  Good  Fund of Knowledge: Good  Language:  Good  Assets:  Desire for Improvement Financial Resources/Insurance Housing Transportation Vocational/Educational  ADL's:  Intact  Cognition: WNL  Prognosis:  Good   DIAGNOSES:    ICD-10-CM   1. Recurrent major depression in partial remission (HCC)  F33.41     2. Marital stress  Z63.0      Receiving Psychotherapy: No   [name] in the past  RECOMMENDATIONS: PDMP was reviewed. No controlled substances.   I provided 20  minutes of face to face time during this encounter, including time spent before and after the visit in records review, medical decision making, counseling pertinent to today's visit, and charting.   I am glad to see her doing better. We will decrease the Wellbutrin, depending on how she responds we may stop it at the next visit.  Decrease Wellbutrin XL to 150 mg, 1 p.o. daily. Continue Prozac 20 mg, 1 p.o. daily. Encouraged her to take multivitamin, B complex, vitamin D, and fish oil daily. Restart therapy with [name]. Return in 4 to 6 weeks.  [name], PA-C

## 2023-03-31 IMAGING — MG MM DIGITAL SCREENING BILAT W/ TOMO AND CAD
8 series · 8 of 24 positions shown · non-contrast
Comparison: Previous exam(s).

CLINICAL DATA: Screening.

EXAM:
DIGITAL SCREENING BILATERAL MAMMOGRAM WITH TOMOSYNTHESIS AND CAD
TECHNIQUE: Bilateral screening digital craniocaudal and mediolateral oblique
mammograms were obtained. Bilateral screening digital breast
tomosynthesis was performed. The images were evaluated with
computer-aided detection.

[L CC synth-2D]
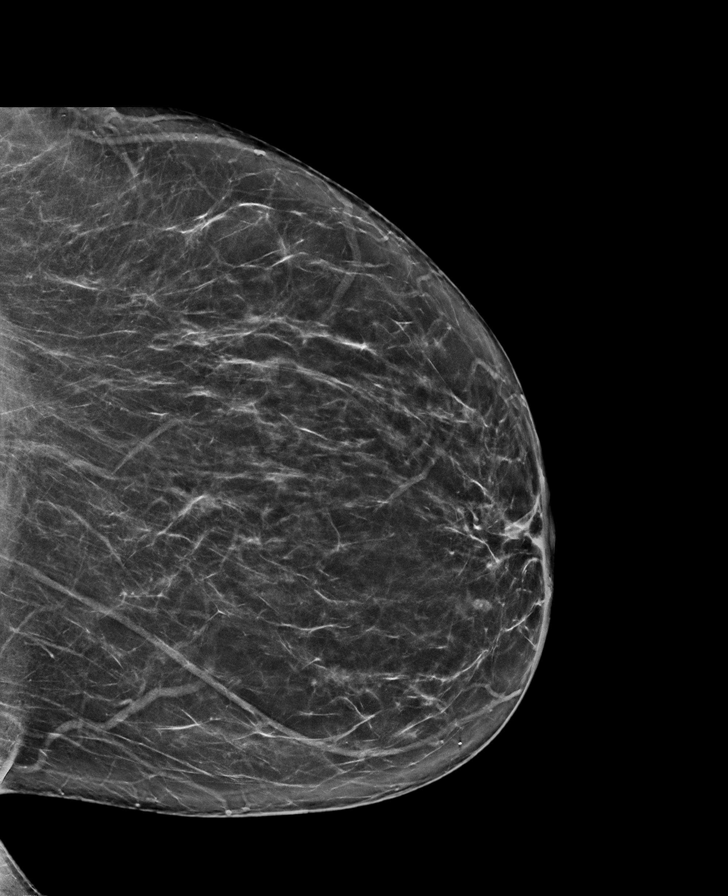

[L MLO synth-2D]
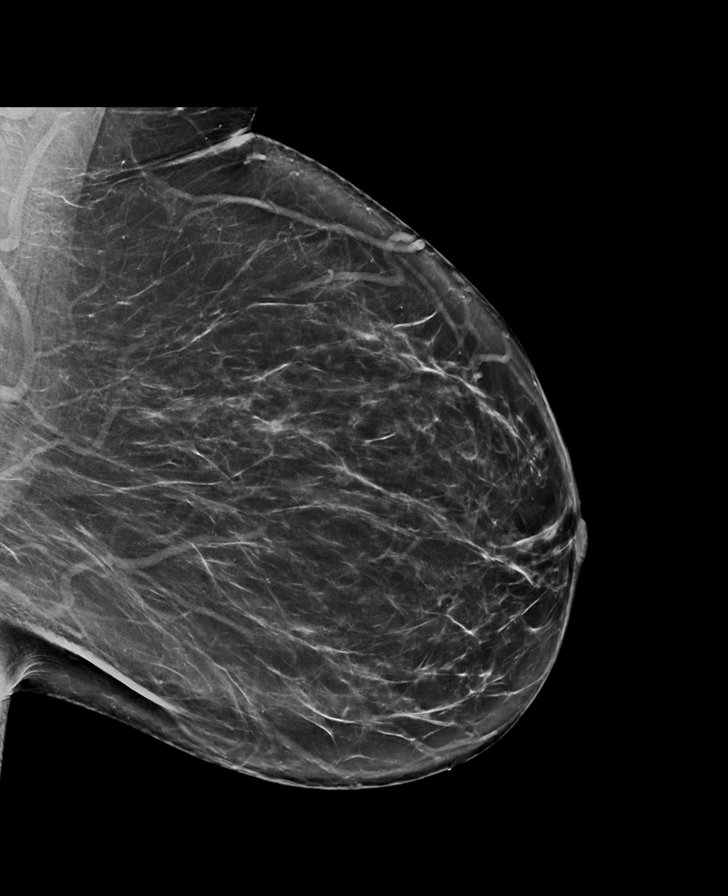

[R MLO synth-2D]
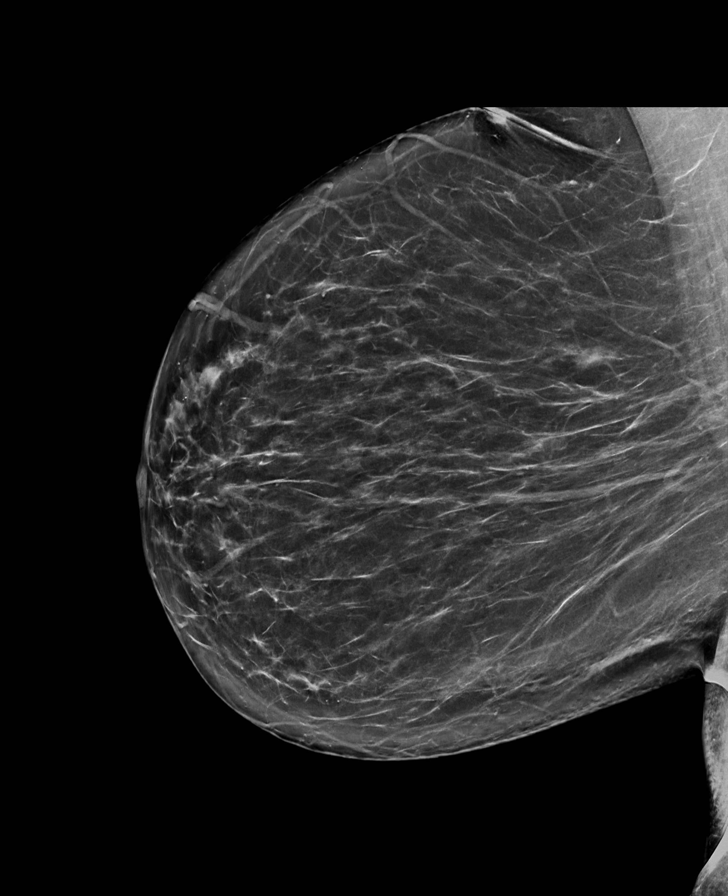

[R CC synth-2D]
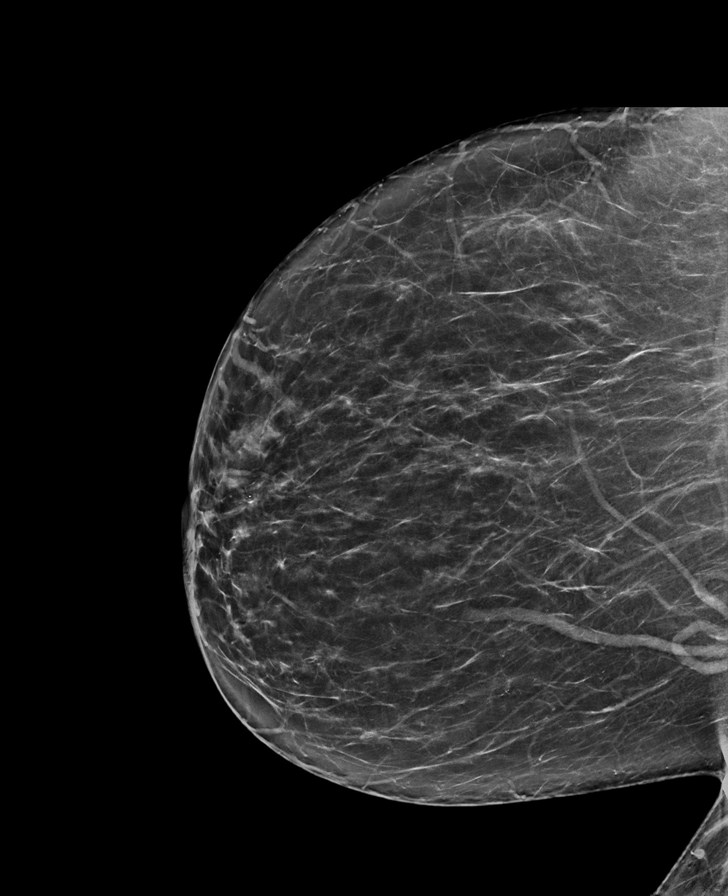

[R CC tomo · tomo slice 39/77.0]
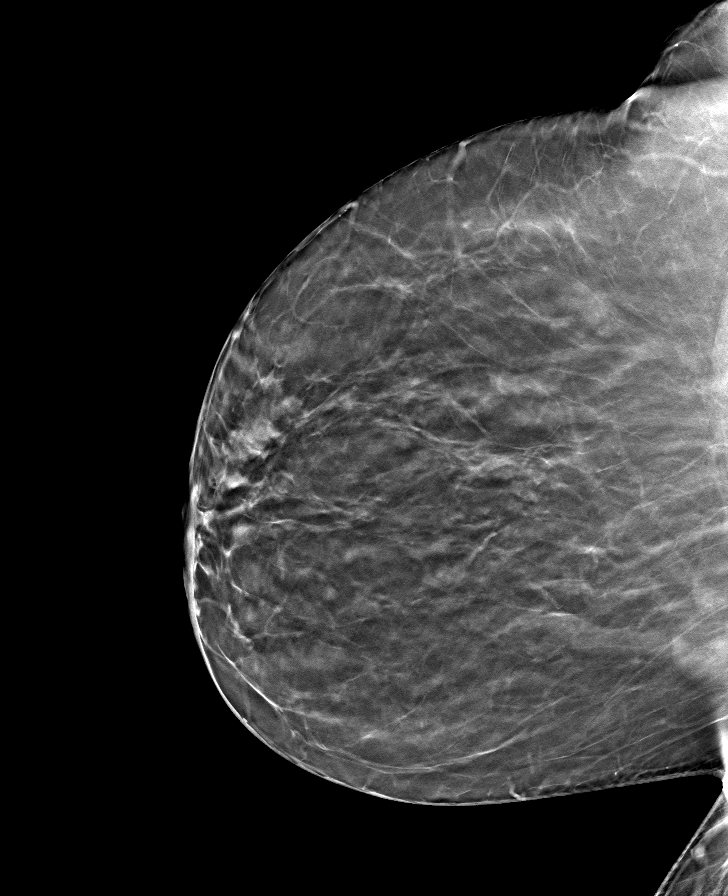

[L CC tomo · tomo slice 38/75.0]
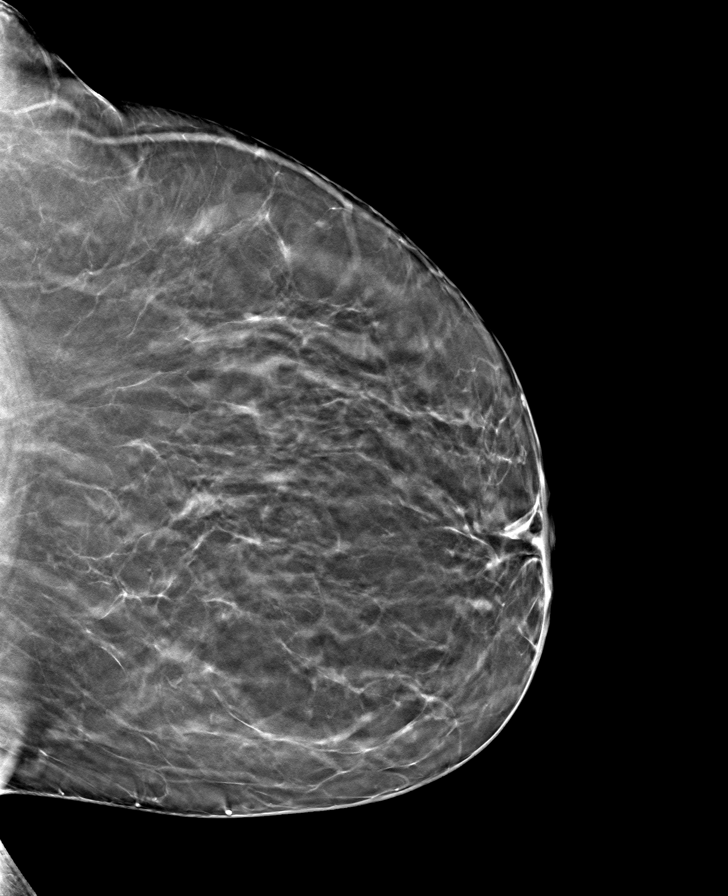

[R MLO tomo · tomo slice 41/81.0]
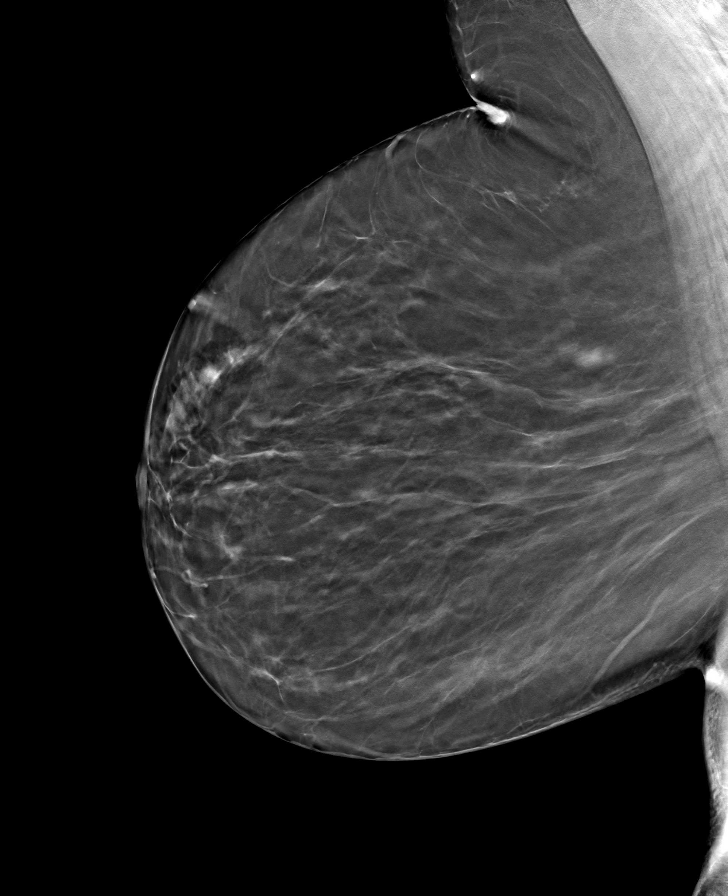

[L MLO tomo · tomo slice 43/85.0]
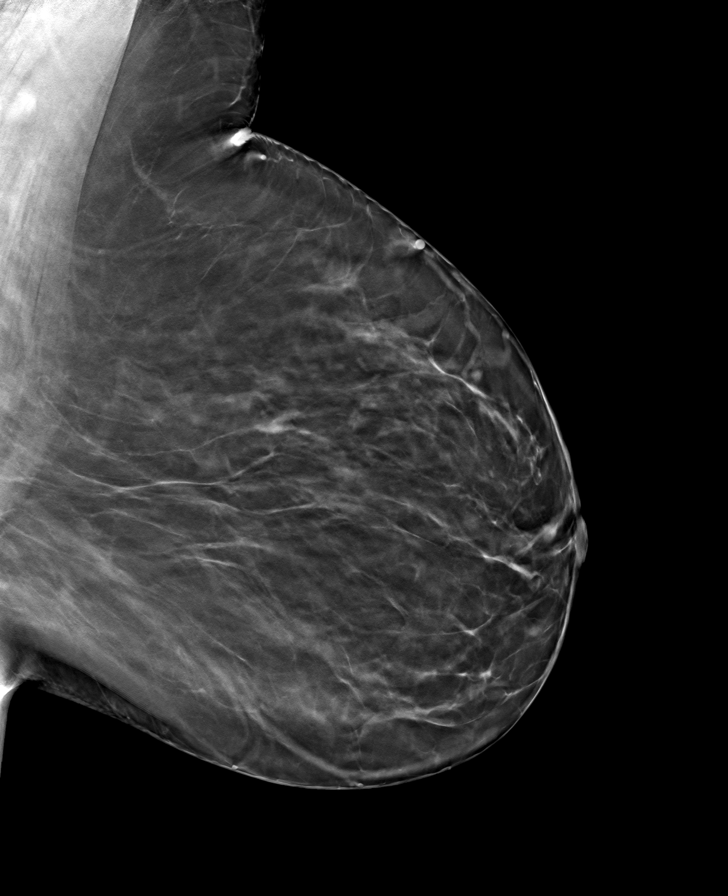

[8 of 24 positions shown; findings below may reference images not displayed]

ACR Breast Density Category b: There are scattered areas of
fibroglandular density.
FINDINGS: There are no findings suspicious for malignancy.
IMPRESSION: No mammographic evidence of malignancy. A result letter of this
screening mammogram will be mailed directly to the patient.

RECOMMENDATION:
Screening mammogram in one year. (Code:51-O-LD2)

BI-RADS CATEGORY  1: Negative.

## 2023-05-05 ENCOUNTER — Ambulatory Visit: Payer: Federal, State, Local not specified - PPO | Admitting: Physician Assistant

## 2023-05-05 ENCOUNTER — Encounter: Payer: Self-pay | Admitting: Physician Assistant

## 2023-05-05 DIAGNOSIS — F19981 Other psychoactive substance use, unspecified with psychoactive substance-induced sexual dysfunction: Secondary | ICD-10-CM

## 2023-05-05 DIAGNOSIS — F3341 Major depressive disorder, recurrent, in partial remission: Secondary | ICD-10-CM | POA: Diagnosis not present

## 2023-05-05 MED ORDER — BUPROPION HCL 75 MG PO TABS: ORAL_TABLET | ORAL | 0 refills | Status: DC

## 2023-05-05 NOTE — Progress Notes (Signed)
[facility]  Patient ID: Monique Hamilton,  MRN: [id number]  PCP: Sigmund Hazel, MD  Date of Evaluation: 05/05/2023 Time spent:20 minutes  Chief Complaint:  Chief Complaint   Depression    HISTORY/CURRENT STATUS: HPI For routine med check.  Decreased the dose of Wellbutrin 5 weeks ago.  States she feels better.  She is able to enjoy things.  Energy and motivation are good.  Appetite is normal and weight is stable.  ADLs and personal hygiene are normal.  She sleeps well.  No suicidal or homicidal thoughts.  Not having a lot of anxiety.  She is busy with work but it is going well.  A little stress at home because her middle child, a son, is going off to [place] soon.  She still has her daughter at home who is a Holiday representative in high school.  Her oldest son is also at [employer].  Patient denies increased energy with decreased need for sleep, increased talkativeness, racing thoughts, impulsivity or risky behaviors, increased spending, increased libido, grandiosity, increased irritability or anger, paranoia, or hallucinations.  Denies dizziness, syncope, seizures, numbness, tingling, tremor, tics, unsteady gait, slurred speech, confusion. Denies muscle or joint pain, stiffness, or dystonia.  Individual Medical History/ Review of Systems: Changes? :No    Past medications for mental health diagnoses include: Prozac caused sexual side affects, Effexor for post-partum, Wellbutrin  Allergies: Lemon flavor   Current Medications:  Current Outpatient Medications:    acetaminophen (TYLENOL) 650 MG CR tablet, Take 650 mg by mouth every 8 (eight) hours as needed for pain., Disp: , Rfl:    Apoaequorin (PREVAGEN PO), Take by mouth., Disp: , Rfl:    buPROPion (WELLBUTRIN) 75 MG tablet, 1.5 pills q am for 2 weeks, then 1 q am for 2 weeks, then 1/2 pill q am for 2 weeks then stop, Disp: 42 tablet, Rfl: 0   FLUoxetine (PROZAC) 20 MG capsule, Take 1 capsule (20 mg total) by mouth  daily., Disp: 90 capsule, Rfl: 1   ibuprofen (ADVIL,MOTRIN) 200 MG tablet, Take 600 mg by mouth every 6 (six) hours as needed for pain. , Disp: , Rfl:    MYRBETRIQ 25 MG TB24 tablet, Take 25 mg by mouth daily., Disp: , Rfl:    cholecalciferol (VITAMIN D) 1000 units tablet, Take 1,000 Units by mouth daily. (Patient not taking: Reported on 02/23/2023), Disp: , Rfl:    estradiol (CLIMARA) 0.06 MG/24HR, Place onto the skin., Disp: , Rfl:  Medication Side Effects: none  Family Medical/ Social History: Changes? No  MENTAL HEALTH EXAM:  There were no vitals taken for this visit.There is no height or weight on file to calculate BMI.  General Appearance: Casual, Neat and Well Groomed  Eye Contact:  Good  Speech:  Clear and Coherent and Normal Rate  Volume:  Normal  Mood:  Euthymic  Affect:  Congruent  Thought Process:  Goal Directed and Descriptions of Associations: Circumstantial  Orientation:  Full (Time, Place, and Person)  Thought Content: Logical   Suicidal Thoughts:  No  Homicidal Thoughts:  No  Memory:  WNL  Judgement:  Good  Insight:  Good  Psychomotor Activity:  Normal  Concentration:  Concentration: Good and Attention Span: Good  Recall:  Good  Fund of Knowledge: Good  Language: Good  Assets:  Communication Skills Desire for Improvement Financial Resources/Insurance Housing Transportation Vocational/Educational  ADL's:  Intact  Cognition: WNL  Prognosis:  Good   DIAGNOSES:    ICD-10-CM   1. Recurrent  major depression in partial remission (HCC)  F33.41     2. 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 No controlled substances.   I provided 20 minutes of face to face time during this encounter, including time spent before and after the visit in records review, medical decision making, counseling pertinent to today's visit, and charting.   We  discussed weaning off the Wellbutrin completely.  She would like to try it.  She will watch for decreased energy and motivation and sexual dysfunction and let me know if those occur.  If so then we need to restart the Wellbutrin.  Wean off Wellbutrin 75 mg, take 1.5 pills daily for 2 weeks, then 1 pill daily for 2 weeks, then 1/2 pill daily for 2 weeks and then stop. Continue Prozac 20 mg, 1 p.o. daily. Encouraged her to take multivitamin, B complex, vitamin D, and fish oil daily. Continue therapy with [name] as needed. Return in 2 months.  [name], PA-C

## 2023-07-15 ENCOUNTER — Encounter: Payer: Self-pay | Admitting: Physician Assistant

## 2023-07-15 ENCOUNTER — Ambulatory Visit (INDEPENDENT_AMBULATORY_CARE_PROVIDER_SITE_OTHER): Payer: Federal, State, Local not specified - PPO | Admitting: Physician Assistant

## 2023-07-15 DIAGNOSIS — F3341 Major depressive disorder, recurrent, in partial remission: Secondary | ICD-10-CM

## 2023-07-15 DIAGNOSIS — F19981 Other psychoactive substance use, unspecified with psychoactive substance-induced sexual dysfunction: Secondary | ICD-10-CM

## 2023-07-15 NOTE — Progress Notes (Signed)
[facility]  Patient ID: Monique Hamilton,  MRN: [id number]  PCP: Sigmund Hazel, MD  Date of Evaluation: 07/15/2023 Time spent:20 minutes  Chief Complaint:  Chief Complaint   Depression    HISTORY/CURRENT STATUS: HPI For routine med check.  We weaned off Wellbutrin a few months ago.  She has tolerated that fine. She is able to enjoy things.  Energy and motivation are good.  Work is going well.   No extreme sadness, tearfulness, or feelings of hopelessness.  Sleeps well most of the time. ADLs and personal hygiene are normal.   Denies any changes in concentration, making decisions, or remembering things.  Appetite has not changed.  Weight is stable.  No complaints of anxiety.  Denies suicidal or homicidal thoughts.  Patient denies increased energy with decreased need for sleep, increased talkativeness, racing thoughts, impulsivity or risky behaviors, increased spending, increased libido, grandiosity, increased irritability or anger, paranoia, or hallucinations.  Denies dizziness, syncope, seizures, numbness, tingling, tremor, tics, unsteady gait, slurred speech, confusion. Denies muscle or joint pain, stiffness, or dystonia.  Individual Medical History/ Review of Systems: Changes? :No    Past medications for mental health diagnoses include: Prozac caused sexual side affects, Effexor for post-partum, Wellbutrin  Allergies: Lemon flavor   Current Medications:  Current Outpatient Medications:    acetaminophen (TYLENOL) 650 MG CR tablet, Take 650 mg by mouth every 8 (eight) hours as needed for pain., Disp: , Rfl:    Apoaequorin (PREVAGEN PO), Take by mouth., Disp: , Rfl:    cholecalciferol (VITAMIN D) 1000 units tablet, Take 1,000 Units by mouth daily., Disp: , Rfl:    FLUoxetine (PROZAC) 20 MG capsule, Take 1 capsule (20 mg total) by mouth daily., Disp: 90 capsule, Rfl: 1   ibuprofen (ADVIL,MOTRIN) 200 MG tablet, Take 600 mg by mouth every 6 (six) hours as needed for  pain. , Disp: , Rfl:    MYRBETRIQ 25 MG TB24 tablet, Take 25 mg by mouth daily., Disp: , Rfl:    estradiol (CLIMARA) 0.06 MG/24HR, Place onto the skin., Disp: , Rfl:  Medication Side Effects: sexual dysfunction  Family Medical/ Social History: Changes? No  MENTAL HEALTH EXAM:  There were no vitals taken for this visit.There is no height or weight on file to calculate BMI.  General Appearance: Casual, Neat and Well Groomed  Eye Contact:  Good  Speech:  Clear and Coherent and Normal Rate  Volume:  Normal  Mood:  Euthymic  Affect:  Congruent  Thought Process:  Goal Directed and Descriptions of Associations: Circumstantial  Orientation:  Full (Time, Place, and Person)  Thought Content: Logical   Suicidal Thoughts:  No  Homicidal Thoughts:  No  Memory:  WNL  Judgement:  Good  Insight:  Good  Psychomotor Activity:  Normal  Concentration:  Concentration: Good and Attention Span: Good  Recall:  Good  Fund of Knowledge: Good  Language: Good  Assets:  Communication Skills Desire for Improvement Financial Resources/Insurance Housing Resilience Transportation Vocational/Educational  ADL's:  Intact  Cognition: WNL  Prognosis:  Good   DIAGNOSES:    ICD-10-CM   1. Recurrent major depression in partial remission (HCC)  F33.41     2. 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 No controlled substances.   I provided 20 minutes of face to face time during this encounter, including time spent before and after  the visit in records review, medical decision making, counseling pertinent to today's visit, and charting.   She is doing well except for the sexual dysfunction from the Prozac.  The Wellbutrin did not help much at all.  She is willing to deal with that because the Prozac helps her feel better mentally.  She will also discuss the sexual dysfunction with her  gynecologist.  Continue Prozac 20 mg, 1 p.o. daily. Continue vitamins as per med list.   Continue therapy with [name] as needed. Return in 6 months.  Monique Overly, PA-C

## 2023-08-19 ENCOUNTER — Other Ambulatory Visit: Payer: Self-pay | Admitting: Physician Assistant

## 2023-08-19 NOTE — Telephone Encounter (Signed)
LF 08/2- I kept disp and rf the same to give pt enough to next appt.

## 2023-09-07 DIAGNOSIS — N3941 Urge incontinence: Secondary | ICD-10-CM | POA: Diagnosis not present

## 2023-09-07 DIAGNOSIS — R3915 Urgency of urination: Secondary | ICD-10-CM | POA: Diagnosis not present

## 2023-10-29 DIAGNOSIS — M722 Plantar fascial fibromatosis: Secondary | ICD-10-CM | POA: Diagnosis not present

## 2023-10-29 DIAGNOSIS — M21621 Bunionette of right foot: Secondary | ICD-10-CM | POA: Diagnosis not present

## 2023-11-02 DIAGNOSIS — K635 Polyp of colon: Secondary | ICD-10-CM | POA: Diagnosis not present

## 2023-11-02 DIAGNOSIS — Z860101 Personal history of adenomatous and serrated colon polyps: Secondary | ICD-10-CM | POA: Diagnosis not present

## 2023-11-02 DIAGNOSIS — D124 Benign neoplasm of descending colon: Secondary | ICD-10-CM | POA: Diagnosis not present

## 2023-11-02 DIAGNOSIS — Z09 Encounter for follow-up examination after completed treatment for conditions other than malignant neoplasm: Secondary | ICD-10-CM | POA: Diagnosis not present

## 2023-11-02 DIAGNOSIS — D122 Benign neoplasm of ascending colon: Secondary | ICD-10-CM | POA: Diagnosis not present

## 2023-11-18 ENCOUNTER — Other Ambulatory Visit: Payer: Self-pay | Admitting: Family Medicine

## 2023-11-18 DIAGNOSIS — Z1231 Encounter for screening mammogram for malignant neoplasm of breast: Secondary | ICD-10-CM

## 2023-11-19 DIAGNOSIS — M24572 Contracture, left ankle: Secondary | ICD-10-CM | POA: Diagnosis not present

## 2023-11-27 ENCOUNTER — Ambulatory Visit
Admission: RE | Admit: 2023-11-27 | Discharge: 2023-11-27 | Disposition: A | Discharge status: Home / Self Care | Payer: Federal, State, Local not specified - PPO | Source: Ambulatory Visit | Attending: Family Medicine | Admitting: Family Medicine

## 2023-11-27 DIAGNOSIS — Z1231 Encounter for screening mammogram for malignant neoplasm of breast: Secondary | ICD-10-CM

## 2023-12-23 DIAGNOSIS — F4323 Adjustment disorder with mixed anxiety and depressed mood: Secondary | ICD-10-CM | POA: Diagnosis not present

## 2023-12-29 DIAGNOSIS — F4323 Adjustment disorder with mixed anxiety and depressed mood: Secondary | ICD-10-CM | POA: Diagnosis not present

## 2024-01-05 DIAGNOSIS — F4323 Adjustment disorder with mixed anxiety and depressed mood: Secondary | ICD-10-CM | POA: Diagnosis not present

## 2024-01-12 ENCOUNTER — Encounter: Payer: Self-pay | Admitting: Physician Assistant

## 2024-01-12 ENCOUNTER — Ambulatory Visit (INDEPENDENT_AMBULATORY_CARE_PROVIDER_SITE_OTHER): Payer: Federal, State, Local not specified - PPO | Admitting: Physician Assistant

## 2024-01-12 DIAGNOSIS — F3342 Major depressive disorder, recurrent, in full remission: Secondary | ICD-10-CM | POA: Diagnosis not present

## 2024-01-12 MED ORDER — FLUOXETINE HCL 20 MG PO CAPS: 20.0000 mg | ORAL_CAPSULE | Freq: Every day | ORAL | 1 refills | Status: DC

## 2024-01-12 NOTE — Progress Notes (Signed)
 [facility]  Patient ID: Monique Hamilton,  MRN: [id number]  PCP: Sigmund Hazel, MD  Date of Evaluation: 01/12/2024 Time spent:20 minutes  Chief Complaint:  Chief Complaint   Depression; Follow-up    HISTORY/CURRENT STATUS: HPI For routine med check.  Monique Hamilton is doing well.  She feels like the Prozac is still working well.  Patient is able to enjoy things.  Energy and motivation are good.  Work is going well.  Is very busy.  No extreme sadness, tearfulness, or feelings of hopelessness.  Sleeps well most of the time. ADLs and personal hygiene are normal.   Denies any changes in concentration, making decisions, or remembering things.  Appetite has not changed.  Weight is stable.  No complaints of anxiety.   Denies suicidal or homicidal thoughts.  Patient denies increased energy with decreased need for sleep, increased talkativeness, racing thoughts, impulsivity or risky behaviors, increased spending, increased libido, grandiosity, increased irritability or anger, paranoia, or hallucinations.  Denies dizziness, syncope, seizures, numbness, tingling, tremor, tics, unsteady gait, slurred speech, confusion. Denies muscle or joint pain, stiffness, or dystonia.  Individual Medical History/ Review of Systems: Changes? :No    Past medications for mental health diagnoses include: Prozac caused sexual side affects, Effexor for post-partum, Wellbutrin  Allergies: Lemon flavoring agent (non-screening)   Current Medications:  Current Outpatient Medications:    acetaminophen (TYLENOL) 650 MG CR tablet, Take 650 mg by mouth every 8 (eight) hours as needed for pain., Disp: , Rfl:    Apoaequorin (PREVAGEN PO), Take by mouth., Disp: , Rfl:    cholecalciferol (VITAMIN D) 1000 units tablet, Take 1,000 Units by mouth daily., Disp: , Rfl:    ibuprofen (ADVIL,MOTRIN) 200 MG tablet, Take 600 mg by mouth every 6 (six) hours as needed for pain. , Disp: , Rfl:    MYRBETRIQ 25 MG TB24 tablet,  Take 25 mg by mouth daily., Disp: , Rfl:    estradiol (CLIMARA) 0.06 MG/24HR, Place onto the skin., Disp: , Rfl:    FLUoxetine (PROZAC) 20 MG capsule, Take 1 capsule (20 mg total) by mouth daily., Disp: 90 capsule, Rfl: 1 Medication Side Effects: sexual dysfunction  Family Medical/ Social History: Changes? No  MENTAL HEALTH EXAM:  Last menstrual period 03/22/2015.There is no height or weight on file to calculate BMI.  General Appearance: Casual, Neat and Well Groomed  Eye Contact:  Good  Speech:  Clear and Coherent and Normal Rate  Volume:  Normal  Mood:  Euthymic  Affect:  Congruent  Thought Process:  Goal Directed and Descriptions of Associations: Circumstantial  Orientation:  Full (Time, Place, and Person)  Thought Content: Logical   Suicidal Thoughts:  No  Homicidal Thoughts:  No  Memory:  WNL  Judgement:  Good  Insight:  Good  Psychomotor Activity:  Normal  Concentration:  Concentration: Good and Attention Span: Good  Recall:  Good  Fund of Knowledge: Good  Language: Good  Assets:  Communication Skills Desire for Improvement Financial Resources/Insurance Housing Resilience Transportation Vocational/Educational  ADL's:  Intact  Cognition: WNL  Prognosis:  Good   DIAGNOSES:    ICD-10-CM   1. Recurrent major depressive disorder, in full remission (HCC)  F33.42       Receiving Psychotherapy: No   [name] in the past  RECOMMENDATIONS: PDMP was reviewed. No controlled substances.   I provided  20 minutes of face to face time during this encounter, including time spent before and after the visit in records review, medical decision making,  counseling pertinent to today's visit, and charting.   Monique Hamilton is still responding well to the Prozac so no changes will be made.  Continue Prozac 20 mg, 1 p.o. daily. Continue vitamins as per med list.   Continue therapy with [name] as needed. Return in 6 months.  [name], PA-C

## 2024-01-18 DIAGNOSIS — F4323 Adjustment disorder with mixed anxiety and depressed mood: Secondary | ICD-10-CM | POA: Diagnosis not present

## 2024-02-02 DIAGNOSIS — M722 Plantar fascial fibromatosis: Secondary | ICD-10-CM | POA: Diagnosis not present

## 2024-02-02 DIAGNOSIS — F4323 Adjustment disorder with mixed anxiety and depressed mood: Secondary | ICD-10-CM | POA: Diagnosis not present

## 2024-03-01 DIAGNOSIS — M24572 Contracture, left ankle: Secondary | ICD-10-CM | POA: Diagnosis not present

## 2024-03-01 DIAGNOSIS — M722 Plantar fascial fibromatosis: Secondary | ICD-10-CM | POA: Diagnosis not present

## 2024-03-28 DIAGNOSIS — T161XXA Foreign body in right ear, initial encounter: Secondary | ICD-10-CM | POA: Diagnosis not present

## 2024-03-28 DIAGNOSIS — Z23 Encounter for immunization: Secondary | ICD-10-CM | POA: Diagnosis not present

## 2024-03-28 DIAGNOSIS — Z0000 Encounter for general adult medical examination without abnormal findings: Secondary | ICD-10-CM | POA: Diagnosis not present

## 2024-03-28 DIAGNOSIS — Z79899 Other long term (current) drug therapy: Secondary | ICD-10-CM | POA: Diagnosis not present

## 2024-03-30 DIAGNOSIS — M21621 Bunionette of right foot: Secondary | ICD-10-CM | POA: Diagnosis not present

## 2024-04-28 DIAGNOSIS — R051 Acute cough: Secondary | ICD-10-CM | POA: Diagnosis not present

## 2024-05-31 DIAGNOSIS — M21621 Bunionette of right foot: Secondary | ICD-10-CM | POA: Diagnosis not present

## 2024-06-06 ENCOUNTER — Other Ambulatory Visit (HOSPITAL_COMMUNITY): Payer: Self-pay | Admitting: Nurse Practitioner

## 2024-06-06 DIAGNOSIS — E213 Hyperparathyroidism, unspecified: Secondary | ICD-10-CM

## 2024-06-14 ENCOUNTER — Ambulatory Visit (HOSPITAL_COMMUNITY)
Admission: RE | Admit: 2024-06-14 | Discharge: 2024-06-14 | Disposition: A | Discharge status: Home / Self Care | Source: Ambulatory Visit | Attending: Nurse Practitioner | Admitting: Nurse Practitioner

## 2024-06-14 ENCOUNTER — Encounter (HOSPITAL_COMMUNITY)
Admission: RE | Admit: 2024-06-14 | Discharge: 2024-06-14 | Disposition: A | Discharge status: Home / Self Care | Source: Ambulatory Visit | Attending: Nurse Practitioner | Admitting: Nurse Practitioner

## 2024-06-14 ENCOUNTER — Encounter (HOSPITAL_COMMUNITY): Payer: Self-pay

## 2024-06-14 DIAGNOSIS — E213 Hyperparathyroidism, unspecified: Secondary | ICD-10-CM | POA: Diagnosis not present

## 2024-06-14 MED ORDER — TECHNETIUM TC 99M SESTAMIBI GENERIC - CARDIOLITE
25.0000 | Freq: Once | INTRAVENOUS | Status: AC | PRN
  Administered 2024-06-14: 23.6 via INTRAVENOUS

## 2024-07-05 DIAGNOSIS — Z6836 Body mass index (BMI) 36.0-36.9, adult: Secondary | ICD-10-CM | POA: Diagnosis not present

## 2024-07-05 DIAGNOSIS — R635 Abnormal weight gain: Secondary | ICD-10-CM | POA: Diagnosis not present

## 2024-07-05 DIAGNOSIS — N951 Menopausal and female climacteric states: Secondary | ICD-10-CM | POA: Diagnosis not present

## 2024-07-05 DIAGNOSIS — Z1329 Encounter for screening for other suspected endocrine disorder: Secondary | ICD-10-CM | POA: Diagnosis not present

## 2024-07-05 DIAGNOSIS — Z131 Encounter for screening for diabetes mellitus: Secondary | ICD-10-CM | POA: Diagnosis not present

## 2024-07-05 DIAGNOSIS — Z130 Encounter for screening for diseases of the blood and blood-forming organs and certain disorders involving the immune mechanism: Secondary | ICD-10-CM | POA: Diagnosis not present

## 2024-07-05 DIAGNOSIS — R5382 Chronic fatigue, unspecified: Secondary | ICD-10-CM | POA: Diagnosis not present

## 2024-07-05 DIAGNOSIS — E7800 Pure hypercholesterolemia, unspecified: Secondary | ICD-10-CM | POA: Diagnosis not present

## 2024-07-05 DIAGNOSIS — E559 Vitamin D deficiency, unspecified: Secondary | ICD-10-CM | POA: Diagnosis not present

## 2024-07-12 DIAGNOSIS — Z1331 Encounter for screening for depression: Secondary | ICD-10-CM | POA: Diagnosis not present

## 2024-07-12 DIAGNOSIS — N951 Menopausal and female climacteric states: Secondary | ICD-10-CM | POA: Diagnosis not present

## 2024-07-14 ENCOUNTER — Encounter: Payer: Self-pay | Admitting: Physician Assistant

## 2024-07-14 ENCOUNTER — Ambulatory Visit: Admitting: Physician Assistant

## 2024-07-14 DIAGNOSIS — F3342 Major depressive disorder, recurrent, in full remission: Secondary | ICD-10-CM | POA: Diagnosis not present

## 2024-07-14 MED ORDER — FLUOXETINE HCL 20 MG PO CAPS: 20.0000 mg | ORAL_CAPSULE | Freq: Every day | ORAL | 1 refills | Status: DC

## 2024-07-14 NOTE — Progress Notes (Signed)
 [facility]  Patient ID: Monique Hamilton,  MRN: [id number]  PCP: Monique Planas, MD  Date of Evaluation: 07/14/2024 Time spent:20 minutes  Chief Complaint:  Chief Complaint   Follow-up    HISTORY/CURRENT STATUS: HPI For routine med check.  Doing well. Enjoying acting, singing, and reading. Work is going ok.  Energy and motivation are good.  No extreme sadness, tearfulness, or feelings of hopelessness.  Sleeps well most of the time. ADLs and personal hygiene are normal.   Denies any changes in concentration, making decisions, or remembering things.  Appetite has not changed.  Weight is stable.  No PA.  Anx occas in keeping w/ circumstances.  No mania, delirium, AH/VH.  No SI/HI.  Individual Medical History/ Review of Systems: Changes? :Yes    has increased Ca and PTH, work up in progress.   Past medications for mental health diagnoses include: Prozac  caused sexual side affects, Effexor for post-partum, Wellbutrin   Allergies: Lemon flavoring agent (non-screening)   Current Medications:  Current Outpatient Medications:    acetaminophen  (TYLENOL ) 650 MG CR tablet, Take 650 mg by mouth every 8 (eight) hours as needed for pain., Disp: , Rfl:    Apoaequorin (PREVAGEN PO), Take by mouth., Disp: , Rfl:    cholecalciferol (VITAMIN D) 1000 units tablet, Take 1,000 Units by mouth daily., Disp: , Rfl:    estradiol  (CLIMARA ) 0.06 MG/24HR, Place onto the skin., Disp: , Rfl:    estradiol  (ESTRACE ) 0.1 MG/GM vaginal cream, Place vaginally., Disp: , Rfl:    ibuprofen (ADVIL,MOTRIN) 200 MG tablet, Take 600 mg by mouth every 6 (six) hours as needed for pain. , Disp: , Rfl:    MYRBETRIQ 25 MG TB24 tablet, Take 25 mg by mouth daily., Disp: , Rfl:    FLUoxetine  (PROZAC ) 20 MG capsule, Take 1 capsule (20 mg total) by mouth daily., Disp: 90 capsule, Rfl: 1 Medication Side Effects: sexual dysfunction  Family Medical/ Social History: Changes? No  MENTAL HEALTH EXAM:  Last menstrual  period 03/22/2015.There is no height or weight on file to calculate BMI.  General Appearance: Casual, Neat and Well Groomed  Eye Contact:  Good  Speech:  Clear and Coherent and Normal Rate  Volume:  Normal  Mood:  Euthymic  Affect:  Congruent  Thought Process:  Goal Directed and Descriptions of Associations: Circumstantial  Orientation:  Full (Time, Place, and Person)  Thought Content: Logical   Suicidal Thoughts:  No  Homicidal Thoughts:  No  Memory:  WNL  Judgement:  Good  Insight:  Good  Psychomotor Activity:  Normal  Concentration:  Concentration: Good and Attention Span: Good  Recall:  Good  Fund of Knowledge: Good  Language: Good  Assets:  Communication Skills Desire for Improvement Financial Resources/Insurance Housing Resilience Social Support Transportation Vocational/Educational  ADL's:  Intact  Cognition: WNL  Prognosis:  Good   DIAGNOSES:    ICD-10-CM   1. Recurrent major depressive disorder, in full remission  F33.42       Receiving Psychotherapy: No   Albino Hamilton in the past  RECOMMENDATIONS: PDMP was reviewed. No controlled substances.   I provided approximately 20 minutes of face to face time during this encounter, including time spent before and after the visit in records review, medical decision making, counseling pertinent to today's visit, and charting.   Monique Hamilton is stable so no changes are needed.   Continue Prozac  20 mg, 1 p.o. daily. Continue vitamins as per med list.   Continue therapy with Monique Hamilton as  needed. Return in 6 months.  Monique Cooks, PA-C

## 2024-07-18 DIAGNOSIS — M21621 Bunionette of right foot: Secondary | ICD-10-CM | POA: Diagnosis not present

## 2024-07-25 DIAGNOSIS — E213 Hyperparathyroidism, unspecified: Secondary | ICD-10-CM | POA: Diagnosis not present

## 2024-08-01 ENCOUNTER — Other Ambulatory Visit: Payer: Self-pay | Admitting: Nurse Practitioner

## 2024-08-01 DIAGNOSIS — E041 Nontoxic single thyroid nodule: Secondary | ICD-10-CM

## 2024-08-16 ENCOUNTER — Ambulatory Visit
Admission: RE | Admit: 2024-08-16 | Discharge: 2024-08-16 | Disposition: A | Discharge status: Home / Self Care | Source: Ambulatory Visit | Attending: Nurse Practitioner | Admitting: Nurse Practitioner

## 2024-08-16 ENCOUNTER — Other Ambulatory Visit (HOSPITAL_COMMUNITY)
Admission: RE | Admit: 2024-08-16 | Discharge: 2024-08-16 | Disposition: A | Discharge status: Home / Self Care | Source: Ambulatory Visit | Attending: Student | Admitting: Student

## 2024-08-16 DIAGNOSIS — E041 Nontoxic single thyroid nodule: Secondary | ICD-10-CM | POA: Insufficient documentation

## 2024-08-21 ENCOUNTER — Other Ambulatory Visit: Payer: Self-pay | Admitting: Physician Assistant

## 2024-08-22 LAB — CYTOLOGY - NON PAP

## 2024-08-23 DIAGNOSIS — E210 Primary hyperparathyroidism: Secondary | ICD-10-CM | POA: Diagnosis not present

## 2024-08-29 DIAGNOSIS — N951 Menopausal and female climacteric states: Secondary | ICD-10-CM | POA: Diagnosis not present

## 2024-08-29 DIAGNOSIS — E038 Other specified hypothyroidism: Secondary | ICD-10-CM | POA: Diagnosis not present

## 2024-08-29 DIAGNOSIS — E559 Vitamin D deficiency, unspecified: Secondary | ICD-10-CM | POA: Diagnosis not present

## 2024-08-31 DIAGNOSIS — N951 Menopausal and female climacteric states: Secondary | ICD-10-CM | POA: Diagnosis not present

## 2024-09-06 DIAGNOSIS — R3915 Urgency of urination: Secondary | ICD-10-CM | POA: Diagnosis not present

## 2024-09-07 ENCOUNTER — Other Ambulatory Visit: Payer: Self-pay | Admitting: Surgery

## 2024-09-07 DIAGNOSIS — E210 Primary hyperparathyroidism: Secondary | ICD-10-CM

## 2024-09-07 DIAGNOSIS — E213 Hyperparathyroidism, unspecified: Secondary | ICD-10-CM

## 2024-09-13 ENCOUNTER — Ambulatory Visit
Admission: RE | Admit: 2024-09-13 | Discharge: 2024-09-13 | Disposition: A | Discharge status: Home / Self Care | Source: Ambulatory Visit | Attending: Surgery | Admitting: Surgery

## 2024-09-13 DIAGNOSIS — E210 Primary hyperparathyroidism: Secondary | ICD-10-CM

## 2024-09-13 DIAGNOSIS — E213 Hyperparathyroidism, unspecified: Secondary | ICD-10-CM

## 2024-09-13 DIAGNOSIS — E042 Nontoxic multinodular goiter: Secondary | ICD-10-CM | POA: Diagnosis not present

## 2024-09-13 MED ORDER — IOPAMIDOL (ISOVUE-300) INJECTION 61%
75.0000 mL | Freq: Once | INTRAVENOUS | Status: AC | PRN
  Administered 2024-09-13: 75 mL via INTRAVENOUS

## 2024-09-20 ENCOUNTER — Ambulatory Visit: Payer: Self-pay | Admitting: Surgery

## 2024-09-20 NOTE — Progress Notes (Signed)
 Telephone call to patient with results of 4D CT scan of the neck.  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  There is a possible left superior parathyroid  adenoma of approximately the same size also noted.  I discussed these results with the patient.  She will require a relatively central incision which will allow for exploration of both sides at the time of surgery.  Usually this can still be performed as an outpatient procedure.  Otherwise, if the procedure requires significant manipulation of parathyroid  tissue, we may have her observed overnight to monitor her calcium levels.  Patient understands and agrees to proceed.  I will enter orders and ask our schedulers to contact the patient to set up a date for neck exploration and parathyroidectomy in the near future.  Krystal Spinner, MD [facility] Surgery Office: [phone]

## 2024-09-29 NOTE — Progress Notes (Signed)
 Date of COVID positive in last 90 days:  PCP - Olam Pinal, MD Cardiologist - n/a  Chest x-ray - N/A EKG - N/A Stress Test - N/A ECHO - N/A Cardiac Cath - N/A Pacemaker/ICD device last checked:N/A Spinal Cord Stimulator:N/A  Bowel Prep - N/A  Sleep Study - yes CPAP - few times a week  Fasting Blood Sugar - N/A Checks Blood Sugar _____ times a day  Last dose of GLP1 agonist-  N/A GLP1 instructions:  Do not take after     Last dose of SGLT-2 inhibitors-  N/A SGLT-2 instructions:  Do not take after     Blood Thinner Instructions: N/A Last dose:   Time: Aspirin Instructions:N/A Last Dose:  Activity level: Can go up a flight of stairs and perform activities of daily living without stopping and without symptoms of chest pain or shortness of breath.  Anesthesia review: N/A  Patient denies shortness of breath, fever, cough and chest pain at PAT appointment  Patient verbalized understanding of instructions that were given to them at the PAT appointment. Patient was also instructed that they will need to review over the PAT instructions again at home before surgery.

## 2024-09-29 NOTE — Patient Instructions (Signed)
 SURGICAL WAITING ROOM VISITATION  Patients having surgery or a procedure may have no more than 2 support people in the waiting area - these visitors may rotate.    Children ages 21 and under will not be able to visit patients in [facility] under most circumstances.   Visitors with respiratory illnesses are discouraged from visiting and should remain at home.  If the patient needs to stay at the hospital during part of their recovery, the visitor guidelines for inpatient rooms apply. Pre-op nurse will coordinate an appropriate time for 1 support person to accompany patient in pre-op.  This support person may not rotate.    Please refer to the [facility] website for the visitor guidelines for Inpatients (after your surgery is over and you are in a regular room).    Your procedure is scheduled on: 10/06/24   Report to [facility] Main Entrance    Report to admitting at 5:15 AM   Call this number if you have problems the morning of surgery [phone]   Do not eat food :After Midnight.   After Midnight you may have the following liquids until 4:30 AM DAY OF SURGERY  Water Non-Citrus Juices (without pulp, NO RED-Apple, White grape, White cranberry) Black Coffee (NO MILK/CREAM OR CREAMERS, sugar ok)  Clear Tea (NO MILK/CREAM OR CREAMERS, sugar ok) regular and decaf                             Plain Jell-O (NO RED)                                           Fruit ices (not with fruit pulp, NO RED)                                     Popsicles (NO RED)                                                               Sports drinks like Gatorade (NO RED)                     If you have questions, please contact your surgeons office.   FOLLOW BOWEL PREP AND ANY ADDITIONAL PRE OP INSTRUCTIONS YOU RECEIVED FROM YOUR SURGEON'S OFFICE!!!     Oral Hygiene is also important to reduce your risk of infection.                                    Remember - BRUSH YOUR TEETH THE  MORNING OF SURGERY WITH YOUR REGULAR TOOTHPASTE  DENTURES WILL BE REMOVED PRIOR TO SURGERY PLEASE DO NOT APPLY Poly grip OR ADHESIVES!!!   Stop all vitamins and herbal supplements 7 days before surgery.   Take these medicines the morning of surgery with A SIP OF WATER: Fluoxetine , Cytomel  Bring CPAP mask and tubing day of surgery.  You may not have any metal on your body including hair pins, jewelry, and body piercing             Do not wear make-up, lotions, powders, perfumes/cologne, or deodorant  Do not wear nail polish including gel and S&S, artificial/acrylic nails, or any other type of covering on natural nails including finger and toenails. If you have artificial nails, gel coating, etc. that needs to be removed by a nail salon please have this removed prior to surgery or surgery may need to be canceled/ delayed if the surgeon/ anesthesia feels like they are unable to be safely monitored.   Do not shave  48 hours prior to surgery.    Do not bring valuables to the hospital. [facility] IS NOT             RESPONSIBLE   FOR VALUABLES.   Contacts, glasses, dentures or bridgework may not be worn into surgery.  DO NOT BRING YOUR HOME MEDICATIONS TO THE HOSPITAL. PHARMACY WILL DISPENSE MEDICATIONS LISTED ON YOUR MEDICATION LIST TO YOU DURING YOUR ADMISSION IN THE HOSPITAL!    Patients discharged on the day of surgery will not be allowed to drive home.  Someone NEEDS to stay with you for the first 24 hours after anesthesia.              Please read over the following fact sheets you were given: IF YOU HAVE QUESTIONS ABOUT YOUR PRE-OP INSTRUCTIONS PLEASE CALL [phone] Monique Hamilton.   If you received a COVID test during your pre-op visit  it is requested that you wear a mask when out in public, stay away from anyone that may not be feeling well and notify your surgeon if you develop symptoms. If you test positive for Covid or have been in contact with anyone  that has tested positive in the last 10 days please notify you surgeon.    [facility] - Preparing for Surgery Before surgery, you can play an important role.  Because skin is not sterile, your skin needs to be as free of germs as possible.  You can reduce the number of germs on your skin by washing with CHG (chlorahexidine gluconate) soap before surgery.  CHG is an antiseptic cleaner which kills germs and bonds with the skin to continue killing germs even after washing. Please DO NOT use if you have an allergy to CHG or antibacterial soaps.  If your skin becomes reddened/irritated stop using the CHG and inform your nurse when you arrive at Short Stay. Do not shave (including legs and underarms) for at least 48 hours prior to the first CHG shower.  You may shave your face/neck.  Please follow these instructions carefully:  1.  Shower with CHG Soap the night before surgery ONLY (DO NOT USE THE SOAP THE MORNING OF SURGERY).  2.  If you choose to wash your hair, wash your hair first as usual with your normal  shampoo.  3.  After you shampoo, rinse your hair and body thoroughly to remove the shampoo.                             4.  Use CHG as you would any other liquid soap.  You can apply chg directly to the skin and wash.  Gently with a scrungie or clean washcloth.  5.  Apply the CHG Soap to your body ONLY FROM THE NECK DOWN.   Do  not use on face/ open                           Wound or open sores. Avoid contact with eyes, ears mouth and   genitals (private parts).                       Wash face,  Genitals (private parts) with your normal soap.             6.  Wash thoroughly, paying special attention to the area where your    surgery  will be performed.  7.  Thoroughly rinse your body with warm water from the neck down.  8.  DO NOT shower/wash with your normal soap after using and rinsing off the CHG Soap.                9.  Pat yourself dry with a clean towel.            10.  Wear clean  pajamas.            11.  Place clean sheets on your bed the night of your first shower and do not  sleep with pets. Day of Surgery : Do not apply any CHG, lotions/deodorants the morning of surgery.  Please wear clean clothes to the hospital/surgery center.  FAILURE TO FOLLOW THESE INSTRUCTIONS MAY RESULT IN THE CANCELLATION OF YOUR SURGERY  PATIENT SIGNATURE_________________________________  NURSE SIGNATURE__________________________________  ________________________________________________________________________

## 2024-09-30 ENCOUNTER — Encounter (HOSPITAL_COMMUNITY): Admission: RE | Admit: 2024-09-30 | Discharge: 2024-09-30 | Attending: Surgery | Admitting: Surgery

## 2024-09-30 ENCOUNTER — Encounter (HOSPITAL_COMMUNITY): Payer: Self-pay

## 2024-09-30 ENCOUNTER — Other Ambulatory Visit: Payer: Self-pay

## 2024-09-30 VITALS — BP 143/96 | HR 64 | Temp 97.9°F | Resp 14 | Ht 66.5 in | Wt 228.0 lb

## 2024-09-30 DIAGNOSIS — Z01812 Encounter for preprocedural laboratory examination: Secondary | ICD-10-CM | POA: Diagnosis not present

## 2024-09-30 DIAGNOSIS — Z01818 Encounter for other preprocedural examination: Secondary | ICD-10-CM

## 2024-09-30 LAB — CBC
HCT: 42.6 % (ref 36.0–46.0)
Hemoglobin: 14 g/dL (ref 12.0–15.0)
MCH: 31 pg (ref 26.0–34.0)
MCHC: 32.9 g/dL (ref 30.0–36.0)
MCV: 94.2 fL (ref 80.0–100.0)
Platelets: 279 K/uL (ref 150–400)
RBC: 4.52 MIL/uL (ref 3.87–5.11)
RDW: 12.7 % (ref 11.5–15.5)
WBC: 6.3 K/uL (ref 4.0–10.5)
nRBC: 0 % (ref 0.0–0.2)

## 2024-10-02 ENCOUNTER — Encounter (HOSPITAL_COMMUNITY): Payer: Self-pay | Admitting: Surgery

## 2024-10-02 DIAGNOSIS — E210 Primary hyperparathyroidism: Secondary | ICD-10-CM | POA: Diagnosis present

## 2024-10-02 NOTE — H&P (Signed)
 REFERRING PHYSICIAN: Thresa Carmelita Stabs, NP  PROVIDER: Demian Maisel Monique SPINNER, MD   Chief Complaint: New Consultation (Primary hyperparathyroidism)  History of Present Illness:  Patient is referred by Carmelita Thresa, NP, from endocrinology and by Dr. Olam Pinal, her primary care physician, for surgical evaluation and management of suspected primary hyperparathyroidism. Patient had been noted to have elevated serum calcium levels dating back a few years. Calcium level had been as high as 11. Recent level was elevated at 10.4 with an ionized calcium level of 5.7. Intact PTH level was unsuppressed at 46. 25-hydroxy vitamin D level was normal at 39.2. 24-hour urine collection for calcium was elevated at 350. Patient is symptomatic. She complains of significant fatigue. She has a history of depression. She complains of brain fog. She denies nephrolithiasis. She has not had a bone density scan. Patient underwent nuclear medicine parathyroid  scan with sestamibi. This was negative for parathyroid  adenoma. Patient underwent an ultrasound examination of the neck which showed a 4 cm thyroid  nodule. Fine-needle aspiration biopsy was performed and this was consistent with a benign follicular nodule, Bethesda category II. Patient has had no prior history of head or neck surgery. There is a history of hyperparathyroidism in the patient's mother who underwent surgery in [place]  for removal of 2 parathyroid  adenomas. There is no family history of other endocrine tumors. Patient now presents accompanied by her husband to discuss further evaluation and possible parathyroid  surgery.  Review of Systems: A complete review of systems was obtained from the patient. I have reviewed this information and discussed as appropriate with the patient. See HPI as well for other ROS.  Review of Systems  Constitutional: Positive for malaise/fatigue.  HENT: Negative.  Eyes: Negative.  Respiratory: Negative.   Cardiovascular: Negative.  Gastrointestinal: Negative.  Genitourinary: Negative.  Musculoskeletal: Negative.  Skin: Negative.  Neurological: Negative.  Endo/Heme/Allergies: Negative.  Psychiatric/Behavioral: Positive for depression.  Brain fog    Medical History: Past Medical History:  Diagnosis Date  Arthritis  Sleep apnea   Patient Active Problem List  Diagnosis  History of laparoscopic adjustable gastric banding  OSA (obstructive sleep apnea)  Herpes simplex infection of genitourinary system  Fitting and adjustment of gastric lap band  Arthritis  Abnormal uterine bleeding  Leiomyoma of uterus  Major depressive disorder, recurrent episode, moderate (CMS-HCC)  Morbid obesity (CMS-HCC)  Primary hyperparathyroidism (HHS-HCC)   Past Surgical History:  Procedure Laterality Date  LAPAROSCOPIC GASTROPLASTY NON-VERTICAL BANDING FOR OBESITY    No Known Allergies  Current Outpatient Medications on File Prior to Visit  Medication Sig Dispense Refill  estradiol  (DOTTI ) patch 0.0375 mg/24 hr  FLUoxetine  (PROZAC ) 20 MG capsule Take 20 mg by mouth once daily  progesterone  (PROMETRIUM ) 100 MG capsule  testosterone  (TESTIM ) 50 mg/5 gram (1 %) gel apply 1/4-1/2 gram (1-2 clicks) daily AS DIRECTED.  fesoterodine (TOVIAZ) 8 mg ER tablet 1 tablet Orally Once a day   No current facility-administered medications on file prior to visit.   Family History  Problem Relation Age of Onset  Obesity Mother  Breast cancer Mother  High blood pressure (Hypertension) Father    Social History   Tobacco Use  Smoking Status Never  Smokeless Tobacco Never    Social History   Socioeconomic History  Marital status: Married  Tobacco Use  Smoking status: Never  Smokeless tobacco: Never  Vaping Use  Vaping status: Never Used  Substance and Sexual Activity  Alcohol use: Yes  Drug use: Never  Sexual activity: Defer  Social Drivers of Health   Housing Stability: Unknown  (09/07/2024)  Housing Stability Vital Sign  Homeless in the Last Year: No   Objective:   Vitals:  BP: (!) 162/101  Pulse: 84  Resp: 16  SpO2: 98%  Weight: (!) 103.4 kg (228 lb)  Height: 168.9 cm (5' 6.5)   Body mass index is 36.25 kg/m.  Physical Exam   GENERAL APPEARANCE Comfortable, no acute issues Development: normal Gross deformities: none  SKIN Rash, lesions, ulcers: none Induration, erythema: none Nodules: none palpable  EYES Conjunctiva and lids: normal Pupils: equal  EARS, NOSE, MOUTH, THROAT External ears: no lesion or deformity External nose: no lesion or deformity Hearing: grossly normal  NECK Symmetric: yes Trachea: midline Thyroid : no obvious palpable nodules in the thyroid  bed  CHEST/CV Not assessed  ABDOMEN Not assessed  GENITOURINARY/RECTAL Not assessed  MUSCULOSKELETAL Station and gait: normal Digits and nails: no clubbing or cyanosis Muscle strength: grossly normal all extremities Deformity: none  LYMPHATIC Cervical: none palpable Supraclavicular: none palpable  PSYCHIATRIC Oriented to person, place, and time: yes Mood and affect: normal for situation Judgment and insight: appropriate for situation   Assessment and Plan:   Primary hyperparathyroidism (HHS-HCC)  Patient is referred by her endocrinologist and her primary care physician for surgical evaluation and management of newly diagnosed primary hyperparathyroidism.  Patient provided with a copy of Parathyroid  Surgery: Treatment for Your Parathyroid  Gland Problem, published by [employer], 12 pages. Book reviewed and explained to patient during visit today.  Today we reviewed her clinical history. We reviewed her recent laboratory studies. We reviewed the results of her nuclear medicine parathyroid  scan and her ultrasound examination. We reviewed the cytopathology results from the biopsy of her thyroid  nodule.  I would like to proceed with a 4D CT scan of the neck with  parathyroid  protocol in hopes of confirming the diagnosis and localizing a parathyroid  adenoma. If this is successful, then she should be a good candidate for minimally invasive outpatient surgery for parathyroidectomy. We discussed the size and location of the surgical incision. We discussed risk and benefits including the risk of recurrent nerve injury. We discussed doing this under general anesthesia as an outpatient procedure. If the CT scan fails to identify a parathyroid  adenoma, then I would consider traditional neck exploration with identification of all 4 parathyroid  glands in hopes of confirming the diagnosis and removing the adenoma.  Patient understands and agrees to proceed with the 4D CT scan. I will contact her with the results and we will make plans for further management at that time.   Monique Spinner, MD [facility] practice Office: [phone]

## 2024-10-05 NOTE — Anesthesia Preprocedure Evaluation (Signed)
 Anesthesia Evaluation  Patient identified by MRN, date of birth, ID band Patient awake    Reviewed: Allergy & Precautions, H&P , NPO status , Patient's Chart, lab work & pertinent test results  Airway Mallampati: III  TM Distance: >3 FB Neck ROM: Full    Dental no notable dental hx. (+) Teeth Intact, Dental Advisory Given   Pulmonary sleep apnea and Continuous Positive Airway Pressure Ventilation    Pulmonary exam normal breath sounds clear to auscultation       Cardiovascular Exercise Tolerance: Good negative cardio ROS  Rhythm:Regular Rate:Normal     Neuro/Psych  Headaches   Depression    negative neurological ROS     GI/Hepatic negative GI ROS, Neg liver ROS,,,  Endo/Other    Class 3 obesity  Renal/GU negative Renal ROS  negative genitourinary   Musculoskeletal  (+) Arthritis , Osteoarthritis,    Abdominal   Peds  Hematology negative hematology ROS (+)   Anesthesia Other Findings   Reproductive/Obstetrics negative OB ROS                              Anesthesia Physical Anesthesia Plan  ASA: 3  Anesthesia Plan: General   Post-op Pain Management: Tylenol  PO (pre-op)*   Induction: Intravenous  PONV Risk Score and Plan: 4 or greater and Ondansetron , Dexamethasone  and Midazolam   Airway Management Planned: Oral ETT  Additional Equipment:   Intra-op Plan:   Post-operative Plan: Extubation in OR  Informed Consent: I have reviewed the patients History and Physical, chart, labs and discussed the procedure including the risks, benefits and alternatives for the proposed anesthesia with the patient or authorized representative who has indicated his/her understanding and acceptance.     Dental advisory given  Plan Discussed with: CRNA  Anesthesia Plan Comments:          Anesthesia Quick Evaluation

## 2024-10-06 ENCOUNTER — Ambulatory Visit (HOSPITAL_COMMUNITY): Admitting: Anesthesiology

## 2024-10-06 ENCOUNTER — Ambulatory Visit (HOSPITAL_COMMUNITY)
Admission: RE | Admit: 2024-10-06 | Discharge: 2024-10-06 | Disposition: A | Discharge status: Home / Self Care | Source: Ambulatory Visit | Attending: Surgery | Admitting: Surgery

## 2024-10-06 ENCOUNTER — Encounter (HOSPITAL_COMMUNITY): Payer: Self-pay | Admitting: Surgery

## 2024-10-06 ENCOUNTER — Encounter (HOSPITAL_COMMUNITY): Payer: Self-pay | Admitting: Medical

## 2024-10-06 ENCOUNTER — Encounter: Admission: RE | Disposition: A | Payer: Self-pay | Attending: Surgery

## 2024-10-06 DIAGNOSIS — E213 Hyperparathyroidism, unspecified: Secondary | ICD-10-CM | POA: Diagnosis not present

## 2024-10-06 DIAGNOSIS — E210 Primary hyperparathyroidism: Secondary | ICD-10-CM | POA: Insufficient documentation

## 2024-10-06 DIAGNOSIS — Z6836 Body mass index (BMI) 36.0-36.9, adult: Secondary | ICD-10-CM | POA: Insufficient documentation

## 2024-10-06 DIAGNOSIS — E66813 Obesity, class 3: Secondary | ICD-10-CM | POA: Diagnosis not present

## 2024-10-06 DIAGNOSIS — G4730 Sleep apnea, unspecified: Secondary | ICD-10-CM | POA: Insufficient documentation

## 2024-10-06 DIAGNOSIS — E041 Nontoxic single thyroid nodule: Secondary | ICD-10-CM | POA: Diagnosis not present

## 2024-10-06 DIAGNOSIS — E065 Other chronic thyroiditis: Secondary | ICD-10-CM | POA: Diagnosis not present

## 2024-10-06 HISTORY — PX: PARATHYROIDECTOMY: SHX19

## 2024-10-06 SURGERY — PARATHYROIDECTOMY
Anesthesia: General | Site: Neck

## 2024-10-06 MED ORDER — OXYCODONE HCL 5 MG PO TABS
5.0000 mg | ORAL_TABLET | Freq: Once | ORAL | Status: AC
  Administered 2024-10-06: 11:00:00 5 mg via ORAL

## 2024-10-06 MED ORDER — CHLORHEXIDINE GLUCONATE CLOTH 2 % EX PADS: 6.0000 | MEDICATED_PAD | Freq: Once | CUTANEOUS | Status: DC

## 2024-10-06 MED ORDER — MIDAZOLAM HCL 2 MG/2ML IJ SOLN
INTRAMUSCULAR | Status: AC
  Filled 2024-10-06: qty 2

## 2024-10-06 MED ORDER — BUPIVACAINE HCL 0.25 % IJ SOLN
INTRAMUSCULAR | Status: DC | PRN
  Administered 2024-10-06: 09:00:00 10 mL

## 2024-10-06 MED ORDER — OXYCODONE HCL 5 MG PO TABS
ORAL_TABLET | ORAL | Status: AC
  Filled 2024-10-06: qty 1

## 2024-10-06 MED ORDER — HYDROMORPHONE HCL 1 MG/ML IJ SOLN
INTRAMUSCULAR | Status: AC
  Filled 2024-10-06: qty 1

## 2024-10-06 MED ORDER — ORAL CARE MOUTH RINSE: 15.0000 mL | Freq: Once | OROMUCOSAL | Status: AC

## 2024-10-06 MED ORDER — LIDOCAINE HCL (PF) 2 % IJ SOLN
INTRAMUSCULAR | Status: DC | PRN
  Administered 2024-10-06: 07:00:00 60 mg via INTRADERMAL

## 2024-10-06 MED ORDER — ARTIFICIAL TEARS OPHTHALMIC OINT
TOPICAL_OINTMENT | OPHTHALMIC | Status: AC
  Filled 2024-10-06: qty 3.5

## 2024-10-06 MED ORDER — HYDROMORPHONE HCL 1 MG/ML IJ SOLN
0.2500 mg | INTRAMUSCULAR | Status: DC | PRN
  Administered 2024-10-06 (×4): 0.5 mg via INTRAVENOUS

## 2024-10-06 MED ORDER — DEXAMETHASONE SOD PHOSPHATE PF 10 MG/ML IJ SOLN
INTRAMUSCULAR | Status: DC | PRN
  Administered 2024-10-06: 08:00:00 10 mg via INTRAVENOUS

## 2024-10-06 MED ORDER — ACETAMINOPHEN 500 MG PO TABS
1000.0000 mg | ORAL_TABLET | Freq: Once | ORAL | Status: AC
  Administered 2024-10-06: 06:00:00 1000 mg via ORAL

## 2024-10-06 MED ORDER — HEMOSTATIC AGENTS (NO CHARGE) OPTIME
TOPICAL | Status: DC | PRN
  Administered 2024-10-06: 09:00:00 1 via TOPICAL

## 2024-10-06 MED ORDER — BUPIVACAINE HCL (PF) 0.25 % IJ SOLN
INTRAMUSCULAR | Status: AC
  Filled 2024-10-06: qty 30

## 2024-10-06 MED ORDER — PROPOFOL 10 MG/ML IV BOLUS
INTRAVENOUS | Status: AC
  Filled 2024-10-06: qty 20

## 2024-10-06 MED ORDER — PROPOFOL 10 MG/ML IV BOLUS
INTRAVENOUS | Status: DC | PRN
  Administered 2024-10-06: 07:00:00 150 mg via INTRAVENOUS
  Administered 2024-10-06: 08:00:00 20 mg via INTRAVENOUS

## 2024-10-06 MED ORDER — ONDANSETRON HCL 4 MG/2ML IJ SOLN
INTRAMUSCULAR | Status: AC
  Filled 2024-10-06: qty 2

## 2024-10-06 MED ORDER — PHENYLEPHRINE HCL-NACL 20-0.9 MG/250ML-% IV SOLN
INTRAVENOUS | Status: DC | PRN
  Administered 2024-10-06: 08:00:00 10 ug/min via INTRAVENOUS

## 2024-10-06 MED ORDER — ONDANSETRON HCL 4 MG/2ML IJ SOLN
INTRAMUSCULAR | Status: DC | PRN
  Administered 2024-10-06: 09:00:00 4 mg via INTRAVENOUS

## 2024-10-06 MED ORDER — CEFAZOLIN SODIUM-DEXTROSE 2-4 GM/100ML-% IV SOLN
2.0000 g | INTRAVENOUS | Status: AC
  Administered 2024-10-06: 08:00:00 2 g via INTRAVENOUS

## 2024-10-06 MED ORDER — 0.9 % SODIUM CHLORIDE (POUR BTL) OPTIME
TOPICAL | Status: DC | PRN
  Administered 2024-10-06: 08:00:00 1000 mL

## 2024-10-06 MED ORDER — TRAMADOL HCL 50 MG PO TABS: 50.0000 mg | ORAL_TABLET | Freq: Four times a day (QID) | ORAL | 0 refills | Status: AC | PRN

## 2024-10-06 MED ORDER — GLYCOPYRROLATE 0.2 MG/ML IJ SOLN
INTRAMUSCULAR | Status: AC
  Filled 2024-10-06: qty 1

## 2024-10-06 MED ORDER — GLYCOPYRROLATE 0.2 MG/ML IJ SOLN
INTRAMUSCULAR | Status: DC | PRN
  Administered 2024-10-06: 09:00:00 .2 mg via INTRAVENOUS

## 2024-10-06 MED ORDER — CEFAZOLIN SODIUM-DEXTROSE 2-4 GM/100ML-% IV SOLN
INTRAVENOUS | Status: AC
  Filled 2024-10-06: qty 100

## 2024-10-06 MED ORDER — LIDOCAINE HCL (PF) 2 % IJ SOLN
INTRAMUSCULAR | Status: AC
  Filled 2024-10-06: qty 5

## 2024-10-06 MED ORDER — SUGAMMADEX SODIUM 200 MG/2ML IV SOLN
INTRAVENOUS | Status: AC
  Filled 2024-10-06: qty 2

## 2024-10-06 MED ORDER — LACTATED RINGERS IV SOLN: INTRAVENOUS | Status: DC

## 2024-10-06 MED ORDER — FENTANYL CITRATE (PF) 100 MCG/2ML IJ SOLN
INTRAMUSCULAR | Status: AC
  Filled 2024-10-06: qty 2

## 2024-10-06 MED ORDER — FENTANYL CITRATE (PF) 100 MCG/2ML IJ SOLN
INTRAMUSCULAR | Status: DC | PRN
  Administered 2024-10-06 (×3): 50 ug via INTRAVENOUS
  Administered 2024-10-06: 07:00:00 100 ug via INTRAVENOUS
  Administered 2024-10-06: 08:00:00 50 ug via INTRAVENOUS

## 2024-10-06 MED ORDER — ROCURONIUM BROMIDE 10 MG/ML (PF) SYRINGE
PREFILLED_SYRINGE | INTRAVENOUS | Status: AC
  Filled 2024-10-06: qty 10

## 2024-10-06 MED ORDER — ROCURONIUM BROMIDE 10 MG/ML (PF) SYRINGE
PREFILLED_SYRINGE | INTRAVENOUS | Status: DC | PRN
  Administered 2024-10-06: 09:00:00 10 mg via INTRAVENOUS
  Administered 2024-10-06: 08:00:00 50 mg via INTRAVENOUS
  Administered 2024-10-06: 08:00:00 20 mg via INTRAVENOUS
  Administered 2024-10-06: 08:00:00 10 mg via INTRAVENOUS

## 2024-10-06 MED ORDER — CHLORHEXIDINE GLUCONATE 0.12 % MT SOLN
15.0000 mL | Freq: Once | OROMUCOSAL | Status: AC
  Administered 2024-10-06: 06:00:00 15 mL via OROMUCOSAL

## 2024-10-06 MED ORDER — MIDAZOLAM HCL 5 MG/5ML IJ SOLN
INTRAMUSCULAR | Status: DC | PRN
  Administered 2024-10-06: 07:00:00 2 mg via INTRAVENOUS

## 2024-10-06 MED ORDER — SUGAMMADEX SODIUM 200 MG/2ML IV SOLN
INTRAVENOUS | Status: DC | PRN
  Administered 2024-10-06: 09:00:00 200 mg via INTRAVENOUS

## 2024-10-06 MED FILL — Fentanyl Citrate Preservative Free (PF) Inj 100 MCG/2ML: INTRAMUSCULAR | Qty: 2 | Status: AC

## 2024-10-06 MED FILL — Acetaminophen Tab 500 MG: ORAL | Qty: 2 | Status: AC

## 2024-10-06 SURGICAL SUPPLY — 30 items
BAG COUNTER SPONGE SURGICOUNT (BAG) ×1 IMPLANT
BLADE SURG 15 STRL LF DISP TIS (BLADE) ×1 IMPLANT
CHLORAPREP W/TINT 26 (MISCELLANEOUS) ×1 IMPLANT
CLIP TI MEDIUM 6 (CLIP) ×2 IMPLANT
CLIP TI WIDE RED SMALL 6 (CLIP) ×2 IMPLANT
COVER SURGICAL LIGHT HANDLE (MISCELLANEOUS) ×1 IMPLANT
DERMABOND ADVANCED .7 DNX12 (GAUZE/BANDAGES/DRESSINGS) ×1 IMPLANT
DRAPE LAPAROTOMY T 98X78 PEDS (DRAPES) ×1 IMPLANT
DRAPE UTILITY XL STRL (DRAPES) ×1 IMPLANT
ELECT PENCIL ROCKER SW 15FT (MISCELLANEOUS) ×1 IMPLANT
ELECT REM PT RETURN 15FT ADLT (MISCELLANEOUS) ×1 IMPLANT
GAUZE 4X4 16PLY ~~LOC~~+RFID DBL (SPONGE) ×1 IMPLANT
GLOVE SURG ORTHO 8.0 STRL STRW (GLOVE) ×1 IMPLANT
GOWN STRL REUS W/ TWL XL LVL3 (GOWN DISPOSABLE) ×3 IMPLANT
HEMOSTAT SURGICEL 2X4 FIBR (HEMOSTASIS) ×1 IMPLANT
ILLUMINATOR WAVEGUIDE N/F (MISCELLANEOUS) IMPLANT
KIT BASIN OR (CUSTOM PROCEDURE TRAY) ×1 IMPLANT
KIT TURNOVER KIT A (KITS) ×1 IMPLANT
NDL HYPO 22X1.5 SAFETY MO (MISCELLANEOUS) ×1 IMPLANT
NEEDLE HYPO 22X1.5 SAFETY MO (MISCELLANEOUS) ×1 IMPLANT
PACK BASIC VI WITH GOWN DISP (CUSTOM PROCEDURE TRAY) ×1 IMPLANT
PAD MAGNETIC INSTR ST 16X20 (MISCELLANEOUS) ×1 IMPLANT
SHEARS HARMONIC 9CM CVD (BLADE) ×1 IMPLANT
SUT MNCRL AB 4-0 PS2 18 (SUTURE) ×1 IMPLANT
SUT SILK 3-0 18XBRD TIE 12 (SUTURE) IMPLANT
SUT VIC AB 3-0 SH 18 (SUTURE) ×1 IMPLANT
SYR BULB IRRIG 60ML STRL (SYRINGE) ×1 IMPLANT
SYR CONTROL 10ML LL (SYRINGE) ×1 IMPLANT
TOWEL OR DSP ST BLU DLX 10/PK (DISPOSABLE) ×1 IMPLANT
TUBING CONNECTING 10 (TUBING) ×1 IMPLANT

## 2024-10-06 NOTE — Op Note (Signed)
 Operative Note  Pre-operative Diagnosis:  primary hyperparathyroidism  Post-operative Diagnosis:  same  Surgeon:  Krystal Spinner, MD  Assistant:  Tonja Shaper, PA-C   Procedure:  Neck exploration, right superior parathyroidectomy (ectopic), excision right thyroid  nodule  Anesthesia:  general  Estimated Blood Loss:  25 cc  Drains: none         Specimen: to pathology  Indications:  Patient is referred by Carmelita Clover, NP, from endocrinology and by Dr. Olam Pinal, her primary care physician, for surgical evaluation and management of suspected primary hyperparathyroidism. Patient had been noted to have elevated serum calcium levels dating back a few years. Calcium level had been as high as 11. Recent level was elevated at 10.4 with an ionized calcium level of 5.7. Intact PTH level was unsuppressed at 46. 25-hydroxy vitamin D level was normal at 39.2. 24-hour urine collection for calcium was elevated at 350. Patient is symptomatic. She complains of significant fatigue. She has a history of depression. She complains of brain fog. She denies nephrolithiasis. She has not had a bone density scan. Patient underwent nuclear medicine parathyroid  scan with sestamibi. This was negative for parathyroid  adenoma. Patient underwent an ultrasound examination of the neck which showed a 4 cm thyroid  nodule. Fine-needle aspiration biopsy was performed and this was consistent with a benign follicular nodule, Bethesda category II. Patient has had no prior history of head or neck surgery. There is a history of hyperparathyroidism in the patient's mother who underwent surgery in [place]  for removal of 2 parathyroid  adenomas. There is no family history of other endocrine tumors. Patient now presents accompanied by her husband to discuss further evaluation and possible parathyroid  surgery.   Procedure:  The patient was seen in the pre-op holding area. The risks, benefits, complications, treatment options, and  expected outcomes were previously discussed with the patient. The patient agreed with the proposed plan and has signed the informed consent form.  The patient was brought to the operating room by the surgical team, identified as Almarie Arlean Qua and the procedure verified. A time out was completed and the above information confirmed.  Following induction of general anesthesia, the patient was positioned and then prepped and draped in usual aseptic fashion.  After ascertaining that an adequate level of anesthesia been achieved, a small Kocher incision is made with a #15 blade.  Dissection is carried through subcutaneous tissues and the platysma.  Skin flaps are elevated cephalad and caudad.  Self-retaining retractors were placed for exposure.  Strap muscles are incised in the midline.  Dissection has begun on the left side.  Strap muscles are reflected laterally exposing the left thyroid  lobe.  There is a dominant nodule in the inferior pole of the left thyroid  lobe.  This is quite soft and somewhat friable.  Exploration lateral and posterior to the thyroid  gland reveals no evidence of enlarged parathyroid  tissue.  Inferior thyroid  artery is identified.  Lateral edge of the esophagus is identified.  Neck is explored up to the superior pole of the thyroid .  There is no evidence of parathyroid  adenoma.  Upon beginning exploration of the right side, there is a nodular mass at the inferior pole of the right thyroid  lobe.  This is relatively prominent and somewhat separate from the thyroid  parenchyma.  It is gently dissected out and resected with the harmonic scalpel.  It is submitted to pathology for frozen section where a benign appearing thyroid  nodule is confirmed.  Next we turned our attention to the right side.  Strap muscles are again reflected laterally exposing a relatively normal right thyroid  lobe.  Dissection is carried laterally and posteriorly.  Inferior thyroid  artery is identified.   Dissection is carried down to the precervical fascia.  Based on CT scan the area inferior to the thyroid  is explored.  Adjacent to the esophagus going into the posterior mediastinum is an enlarged parathyroid  gland on a long vascular pedicle.  It is gently mobilized out of the right chest.  It measures approximately 2 cm in length.  The vascular pedicle is followed up to the level of the inferior thyroid  artery and then divided between small ligaclips with the harmonic scalpel.  Specimen is submitted to pathology where frozen section biopsy confirms hypercellular parathyroid  tissue consistent with parathyroid  adenoma.  Neck is irrigated with warm saline.  Fibrillar was placed throughout the operative field.  Strap muscles are reapproximated in the midline if interrupted 3-0 Vicryl sutures.  Platysma is closed with interrupted 3-0 Vicryl sutures.  Skin is closed with a running 4-0 Monocryl subcuticular suture.  Local anesthetic is infiltrated circumferentially.  Wound was washed and dried and Dermabond is applied as dressing.  Patient is awakened from anesthesia and transferred to the recovery room in stable condition.  The patient tolerated the procedure well.   Krystal Spinner, MD [facility] Surgery Office: [phone]

## 2024-10-06 NOTE — Anesthesia Procedure Notes (Signed)
 Procedure Name: Intubation Date/Time: 10/06/2024 7:32 AM  Performed by: Franchot Delon RAMAN, CRNAPre-anesthesia Checklist: Patient identified, Emergency Drugs available, Suction available and Patient being monitored Patient Re-evaluated:Patient Re-evaluated prior to induction Oxygen Delivery Method: Circle System Utilized Preoxygenation: Pre-oxygenation with 100% oxygen Induction Type: IV induction Ventilation: Mask ventilation without difficulty Laryngoscope Size: Mac and 3 Grade View: Grade I Tube type: Oral Tube size: 7.0 mm Number of attempts: 1 Airway Equipment and Method: Stylet Placement Confirmation: ETT inserted through vocal cords under direct vision, positive ETCO2 and breath sounds checked- equal and bilateral Secured at: 22 cm Tube secured with: Tape Dental Injury: Teeth and Oropharynx as per pre-operative assessment

## 2024-10-06 NOTE — Anesthesia Postprocedure Evaluation (Signed)
 Anesthesia Post Note  Patient: Monique Hamilton  Procedure(s) Performed: PARATHYROIDECTOMY (Neck)     Patient location during evaluation: PACU Anesthesia Type: General Level of consciousness: awake and alert Pain management: pain level controlled Vital Signs Assessment: post-procedure vital signs reviewed and stable Respiratory status: spontaneous breathing, nonlabored ventilation and respiratory function stable Cardiovascular status: blood pressure returned to baseline and stable Postop Assessment: no apparent nausea or vomiting Anesthetic complications: no   No notable events documented.  Last Vitals:  Vitals:   10/06/24 1100 10/06/24 1107  BP: (!) 149/89 (!) 167/92  Pulse: 80 70  Resp: (!) 23 20  Temp:  36.7 C  SpO2: 93% 92%    Last Pain:  Vitals:   10/06/24 1107  TempSrc: Temporal  PainSc: 3                  Dezirea Mccollister,W. EDMOND

## 2024-10-06 NOTE — Transfer of Care (Signed)
 Immediate Anesthesia Transfer of Care Note  Patient: Monique Hamilton  Procedure(s) Performed: PARATHYROIDECTOMY (Neck)  Patient Location: PACU  Anesthesia Type:General  Level of Consciousness: awake, alert , oriented, and patient cooperative  Airway & Oxygen Therapy: Patient Spontanous Breathing and Patient connected to face mask oxygen  Post-op Assessment: Report given to RN and Post -op Vital signs reviewed and stable  Post vital signs: Reviewed and stable  Last Vitals:  Vitals Value Taken Time  BP 157/97 10/06/24 09:33  Temp    Pulse 60 10/06/24 09:34  Resp 11 10/06/24 09:34  SpO2 100 % 10/06/24 09:34  Vitals shown include unfiled device data.  Last Pain:  Vitals:   10/06/24 0541  PainSc: 0-No pain         Complications: No notable events documented.

## 2024-10-06 NOTE — Discharge Instructions (Addendum)

## 2024-10-06 NOTE — Interval H&P Note (Signed)
 History and Physical Interval Note:  10/06/2024 7:01 AM  Monique Hamilton  has presented today for surgery, with the diagnosis of HYPERPARATHYROIDISM.  The various methods of treatment have been discussed with the patient and family. After consideration of risks, benefits and other options for treatment, the patient has consented to    Procedures with comments: PARATHYROIDECTOMY (N/A) - WITH NECK EXPLORATION as a surgical intervention.    The patient's history has been reviewed, patient examined, no change in status, stable for surgery.  I have reviewed the patient's chart and labs.  Questions were answered to the patient's satisfaction.    Krystal Spinner, MD [facility] Surgery [facility] practice Office: [phone]   Krystal Spinner

## 2024-10-07 ENCOUNTER — Encounter (HOSPITAL_COMMUNITY): Payer: Self-pay | Admitting: Surgery

## 2024-10-07 LAB — SURGICAL PATHOLOGY

## 2025-01-09 ENCOUNTER — Ambulatory Visit: Admitting: Physician Assistant
